# Patient Record
Sex: Male | Born: 1945 | Race: White | Hispanic: No | Marital: Married | Smoking: Current every day smoker
Health system: Southern US, Community
[De-identification: ages and names within clinical notes are randomized; demographics above are authoritative.]

## PROBLEM LIST (undated history)

## (undated) DIAGNOSIS — J439 Emphysema, unspecified: Secondary | ICD-10-CM

## (undated) DIAGNOSIS — I509 Heart failure, unspecified: Secondary | ICD-10-CM

## (undated) DIAGNOSIS — J449 Chronic obstructive pulmonary disease, unspecified: Secondary | ICD-10-CM

## (undated) DIAGNOSIS — I252 Old myocardial infarction: Secondary | ICD-10-CM

## (undated) DIAGNOSIS — F419 Anxiety disorder, unspecified: Secondary | ICD-10-CM

## (undated) DIAGNOSIS — M1990 Unspecified osteoarthritis, unspecified site: Secondary | ICD-10-CM

## (undated) DIAGNOSIS — K219 Gastro-esophageal reflux disease without esophagitis: Secondary | ICD-10-CM

## (undated) DIAGNOSIS — N390 Urinary tract infection, site not specified: Secondary | ICD-10-CM

## (undated) DIAGNOSIS — I10 Essential (primary) hypertension: Secondary | ICD-10-CM

## (undated) DIAGNOSIS — D649 Anemia, unspecified: Secondary | ICD-10-CM

## (undated) DIAGNOSIS — I2510 Atherosclerotic heart disease of native coronary artery without angina pectoris: Secondary | ICD-10-CM

## (undated) DIAGNOSIS — I639 Cerebral infarction, unspecified: Secondary | ICD-10-CM

## (undated) DIAGNOSIS — C801 Malignant (primary) neoplasm, unspecified: Secondary | ICD-10-CM

## (undated) DIAGNOSIS — I38 Endocarditis, valve unspecified: Secondary | ICD-10-CM

## (undated) DIAGNOSIS — Z9889 Other specified postprocedural states: Secondary | ICD-10-CM

## (undated) HISTORY — PX: LUNG LOBECTOMY: SHX167

## (undated) HISTORY — PX: ANGIOPLASTY: SHX39

## (undated) HISTORY — PX: TONSILLECTOMY: SUR1361

---

## 2014-12-29 ENCOUNTER — Encounter (HOSPITAL_COMMUNITY): Payer: Self-pay

## 2014-12-29 ENCOUNTER — Emergency Department (HOSPITAL_COMMUNITY): Payer: Medicare Other

## 2014-12-29 ENCOUNTER — Encounter (HOSPITAL_COMMUNITY)
Admission: EM | Disposition: A | Discharge status: Hospice | Payer: Self-pay | Source: Home / Self Care | Attending: Neurology

## 2014-12-29 ENCOUNTER — Emergency Department (HOSPITAL_COMMUNITY): Payer: Medicare Other | Admitting: Certified Registered"

## 2014-12-29 ENCOUNTER — Inpatient Hospital Stay (HOSPITAL_COMMUNITY)
Admission: EM | Admit: 2014-12-29 | Discharge: 2015-01-06 | DRG: 023 | Disposition: A | Discharge status: Hospice | Payer: Medicare Other | Attending: Neurology | Admitting: Neurology

## 2014-12-29 ENCOUNTER — Inpatient Hospital Stay (HOSPITAL_COMMUNITY): Payer: Medicare Other

## 2014-12-29 DIAGNOSIS — Z66 Do not resuscitate: Secondary | ICD-10-CM | POA: Diagnosis present

## 2014-12-29 DIAGNOSIS — Z515 Encounter for palliative care: Secondary | ICD-10-CM | POA: Diagnosis present

## 2014-12-29 DIAGNOSIS — Z9911 Dependence on respirator [ventilator] status: Secondary | ICD-10-CM

## 2014-12-29 DIAGNOSIS — R531 Weakness: Secondary | ICD-10-CM | POA: Diagnosis present

## 2014-12-29 DIAGNOSIS — R4701 Aphasia: Secondary | ICD-10-CM | POA: Diagnosis present

## 2014-12-29 DIAGNOSIS — I639 Cerebral infarction, unspecified: Secondary | ICD-10-CM

## 2014-12-29 DIAGNOSIS — E43 Unspecified severe protein-calorie malnutrition: Secondary | ICD-10-CM | POA: Diagnosis present

## 2014-12-29 DIAGNOSIS — K5793 Diverticulitis of intestine, part unspecified, without perforation or abscess with bleeding: Secondary | ICD-10-CM | POA: Diagnosis not present

## 2014-12-29 DIAGNOSIS — C3432 Malignant neoplasm of lower lobe, left bronchus or lung: Secondary | ICD-10-CM | POA: Diagnosis present

## 2014-12-29 DIAGNOSIS — G936 Cerebral edema: Secondary | ICD-10-CM | POA: Diagnosis present

## 2014-12-29 DIAGNOSIS — I672 Cerebral atherosclerosis: Secondary | ICD-10-CM | POA: Diagnosis present

## 2014-12-29 DIAGNOSIS — T17990A Other foreign object in respiratory tract, part unspecified in causing asphyxiation, initial encounter: Secondary | ICD-10-CM | POA: Diagnosis not present

## 2014-12-29 DIAGNOSIS — J9601 Acute respiratory failure with hypoxia: Secondary | ICD-10-CM | POA: Diagnosis present

## 2014-12-29 DIAGNOSIS — I6789 Other cerebrovascular disease: Secondary | ICD-10-CM | POA: Diagnosis not present

## 2014-12-29 DIAGNOSIS — F17200 Nicotine dependence, unspecified, uncomplicated: Secondary | ICD-10-CM | POA: Diagnosis present

## 2014-12-29 DIAGNOSIS — R739 Hyperglycemia, unspecified: Secondary | ICD-10-CM | POA: Diagnosis not present

## 2014-12-29 DIAGNOSIS — Z23 Encounter for immunization: Secondary | ICD-10-CM | POA: Diagnosis not present

## 2014-12-29 DIAGNOSIS — I63032 Cerebral infarction due to thrombosis of left carotid artery: Principal | ICD-10-CM

## 2014-12-29 DIAGNOSIS — J9811 Atelectasis: Secondary | ICD-10-CM | POA: Diagnosis present

## 2014-12-29 DIAGNOSIS — I110 Hypertensive heart disease with heart failure: Secondary | ICD-10-CM | POA: Diagnosis present

## 2014-12-29 DIAGNOSIS — I6602 Occlusion and stenosis of left middle cerebral artery: Secondary | ICD-10-CM | POA: Diagnosis present

## 2014-12-29 DIAGNOSIS — Z7901 Long term (current) use of anticoagulants: Secondary | ICD-10-CM

## 2014-12-29 DIAGNOSIS — I4891 Unspecified atrial fibrillation: Secondary | ICD-10-CM | POA: Diagnosis present

## 2014-12-29 DIAGNOSIS — R402432 Glasgow coma scale score 3-8, at arrival to emergency department: Secondary | ICD-10-CM | POA: Diagnosis present

## 2014-12-29 DIAGNOSIS — E876 Hypokalemia: Secondary | ICD-10-CM | POA: Diagnosis present

## 2014-12-29 DIAGNOSIS — J449 Chronic obstructive pulmonary disease, unspecified: Secondary | ICD-10-CM | POA: Diagnosis present

## 2014-12-29 DIAGNOSIS — Z6825 Body mass index (BMI) 25.0-25.9, adult: Secondary | ICD-10-CM

## 2014-12-29 DIAGNOSIS — K922 Gastrointestinal hemorrhage, unspecified: Secondary | ICD-10-CM | POA: Diagnosis present

## 2014-12-29 DIAGNOSIS — I2510 Atherosclerotic heart disease of native coronary artery without angina pectoris: Secondary | ICD-10-CM | POA: Diagnosis present

## 2014-12-29 DIAGNOSIS — K219 Gastro-esophageal reflux disease without esophagitis: Secondary | ICD-10-CM | POA: Diagnosis present

## 2014-12-29 DIAGNOSIS — J690 Pneumonitis due to inhalation of food and vomit: Secondary | ICD-10-CM | POA: Diagnosis not present

## 2014-12-29 DIAGNOSIS — R2990 Unspecified symptoms and signs involving the nervous system: Secondary | ICD-10-CM

## 2014-12-29 DIAGNOSIS — G9341 Metabolic encephalopathy: Secondary | ICD-10-CM | POA: Diagnosis present

## 2014-12-29 DIAGNOSIS — T17500A Unspecified foreign body in bronchus causing asphyxiation, initial encounter: Secondary | ICD-10-CM

## 2014-12-29 DIAGNOSIS — I6523 Occlusion and stenosis of bilateral carotid arteries: Secondary | ICD-10-CM | POA: Diagnosis not present

## 2014-12-29 DIAGNOSIS — I248 Other forms of acute ischemic heart disease: Secondary | ICD-10-CM | POA: Diagnosis present

## 2014-12-29 DIAGNOSIS — J9690 Respiratory failure, unspecified, unspecified whether with hypoxia or hypercapnia: Secondary | ICD-10-CM | POA: Insufficient documentation

## 2014-12-29 DIAGNOSIS — Z978 Presence of other specified devices: Secondary | ICD-10-CM

## 2014-12-29 DIAGNOSIS — E785 Hyperlipidemia, unspecified: Secondary | ICD-10-CM | POA: Diagnosis present

## 2014-12-29 DIAGNOSIS — Q251 Coarctation of aorta: Secondary | ICD-10-CM | POA: Diagnosis not present

## 2014-12-29 DIAGNOSIS — I63512 Cerebral infarction due to unspecified occlusion or stenosis of left middle cerebral artery: Secondary | ICD-10-CM

## 2014-12-29 DIAGNOSIS — C3490 Malignant neoplasm of unspecified part of unspecified bronchus or lung: Secondary | ICD-10-CM

## 2014-12-29 DIAGNOSIS — J9809 Other diseases of bronchus, not elsewhere classified: Secondary | ICD-10-CM | POA: Diagnosis not present

## 2014-12-29 DIAGNOSIS — G8194 Hemiplegia, unspecified affecting left nondominant side: Secondary | ICD-10-CM | POA: Diagnosis present

## 2014-12-29 DIAGNOSIS — I252 Old myocardial infarction: Secondary | ICD-10-CM

## 2014-12-29 DIAGNOSIS — G931 Anoxic brain damage, not elsewhere classified: Secondary | ICD-10-CM

## 2014-12-29 DIAGNOSIS — M6289 Other specified disorders of muscle: Secondary | ICD-10-CM | POA: Diagnosis not present

## 2014-12-29 DIAGNOSIS — J189 Pneumonia, unspecified organism: Secondary | ICD-10-CM | POA: Insufficient documentation

## 2014-12-29 DIAGNOSIS — I6529 Occlusion and stenosis of unspecified carotid artery: Secondary | ICD-10-CM | POA: Insufficient documentation

## 2014-12-29 DIAGNOSIS — I6340 Cerebral infarction due to embolism of unspecified cerebral artery: Secondary | ICD-10-CM | POA: Diagnosis not present

## 2014-12-29 DIAGNOSIS — I63031 Cerebral infarction due to thrombosis of right carotid artery: Secondary | ICD-10-CM | POA: Diagnosis not present

## 2014-12-29 HISTORY — DX: Malignant (primary) neoplasm, unspecified: C80.1

## 2014-12-29 HISTORY — DX: Anxiety disorder, unspecified: F41.9

## 2014-12-29 HISTORY — DX: Atherosclerotic heart disease of native coronary artery without angina pectoris: I25.10

## 2014-12-29 HISTORY — DX: Urinary tract infection, site not specified: N39.0

## 2014-12-29 HISTORY — DX: Emphysema, unspecified: J43.9

## 2014-12-29 HISTORY — DX: Unspecified osteoarthritis, unspecified site: M19.90

## 2014-12-29 HISTORY — DX: Chronic obstructive pulmonary disease, unspecified: J44.9

## 2014-12-29 HISTORY — DX: Old myocardial infarction: I25.2

## 2014-12-29 HISTORY — DX: Heart failure, unspecified: I50.9

## 2014-12-29 HISTORY — DX: Endocarditis, valve unspecified: I38

## 2014-12-29 HISTORY — DX: Cerebral infarction, unspecified: I63.9

## 2014-12-29 HISTORY — PX: RADIOLOGY WITH ANESTHESIA: SHX6223

## 2014-12-29 HISTORY — DX: Gastro-esophageal reflux disease without esophagitis: K21.9

## 2014-12-29 HISTORY — DX: Other specified postprocedural states: Z98.890

## 2014-12-29 HISTORY — DX: Anemia, unspecified: D64.9

## 2014-12-29 HISTORY — DX: Essential (primary) hypertension: I10

## 2014-12-29 LAB — BLOOD GAS, ARTERIAL
Acid-base deficit: 1.6 mmol/L (ref 0.0–2.0)
BICARBONATE: 22.1 meq/L (ref 20.0–24.0)
Drawn by: 270221
FIO2: 1
LHR: 14 {breaths}/min
MECHVT: 600 mL
O2 SAT: 100 %
PATIENT TEMPERATURE: 98.6
PCO2 ART: 34.2 mmHg — AB (ref 35.0–45.0)
PEEP/CPAP: 5 cmH2O
PO2 ART: 450 mmHg — AB (ref 80.0–100.0)
TCO2: 23.2 mmol/L (ref 0–100)
pH, Arterial: 7.426 (ref 7.350–7.450)

## 2014-12-29 LAB — I-STAT CHEM 8, ED
BUN: 19 mg/dL (ref 6–20)
CHLORIDE: 103 mmol/L (ref 101–111)
CREATININE: 1.2 mg/dL (ref 0.61–1.24)
Calcium, Ion: 1.08 mmol/L — ABNORMAL LOW (ref 1.13–1.30)
Glucose, Bld: 158 mg/dL — ABNORMAL HIGH (ref 65–99)
HEMATOCRIT: 32 % — AB (ref 39.0–52.0)
Hemoglobin: 10.9 g/dL — ABNORMAL LOW (ref 13.0–17.0)
Potassium: 3.9 mmol/L (ref 3.5–5.1)
SODIUM: 136 mmol/L (ref 135–145)
TCO2: 19 mmol/L (ref 0–100)

## 2014-12-29 LAB — MRSA PCR SCREENING: MRSA by PCR: NEGATIVE

## 2014-12-29 LAB — TRIGLYCERIDES: TRIGLYCERIDES: 118 mg/dL (ref <150)

## 2014-12-29 LAB — HEMOGLOBIN AND HEMATOCRIT, BLOOD
HCT: 29.1 % — ABNORMAL LOW (ref 39.0–52.0)
HCT: 30.7 % — ABNORMAL LOW (ref 39.0–52.0)
HEMOGLOBIN: 9.3 g/dL — AB (ref 13.0–17.0)
Hemoglobin: 9.7 g/dL — ABNORMAL LOW (ref 13.0–17.0)

## 2014-12-29 LAB — TROPONIN I
TROPONIN I: 0.29 ng/mL — AB (ref <0.031)
Troponin I: 0.32 ng/mL — ABNORMAL HIGH (ref <0.031)

## 2014-12-29 SURGERY — RADIOLOGY WITH ANESTHESIA
Anesthesia: General

## 2014-12-29 MED ORDER — ROCURONIUM BROMIDE 100 MG/10ML IV SOLN
INTRAVENOUS | Status: DC | PRN
  Administered 2014-12-29: 15 mg via INTRAVENOUS
  Administered 2014-12-29: 30 mg via INTRAVENOUS
  Administered 2014-12-29: 25 mg via INTRAVENOUS

## 2014-12-29 MED ORDER — IOHEXOL 300 MG/ML  SOLN
150.0000 mL | Freq: Once | INTRAMUSCULAR | Status: DC | PRN
  Administered 2014-12-29: 75 mL via INTRAVENOUS
  Filled 2014-12-29: qty 150

## 2014-12-29 MED ORDER — SUCCINYLCHOLINE CHLORIDE 20 MG/ML IJ SOLN
INTRAMUSCULAR | Status: DC | PRN
  Administered 2014-12-29: 120 mg via INTRAVENOUS

## 2014-12-29 MED ORDER — LACTATED RINGERS IV SOLN
INTRAVENOUS | Status: DC | PRN
  Administered 2014-12-29: 14:00:00 via INTRAVENOUS

## 2014-12-29 MED ORDER — PANTOPRAZOLE SODIUM 40 MG IV SOLR
40.0000 mg | INTRAVENOUS | Status: DC
  Administered 2014-12-29: 40 mg via INTRAVENOUS
  Filled 2014-12-29: qty 40

## 2014-12-29 MED ORDER — IOHEXOL 350 MG/ML SOLN
50.0000 mL | Freq: Once | INTRAVENOUS | Status: AC | PRN
  Administered 2014-12-29: 50 mL via INTRAVENOUS

## 2014-12-29 MED ORDER — FENTANYL CITRATE (PF) 100 MCG/2ML IJ SOLN
INTRAMUSCULAR | Status: DC | PRN
  Administered 2014-12-29 (×2): 50 ug via INTRAVENOUS

## 2014-12-29 MED ORDER — NICARDIPINE HCL IN NACL 20-0.86 MG/200ML-% IV SOLN: 5.0000 mg/h | INTRAVENOUS | Status: DC

## 2014-12-29 MED ORDER — SENNOSIDES-DOCUSATE SODIUM 8.6-50 MG PO TABS
1.0000 | ORAL_TABLET | Freq: Every evening | ORAL | Status: DC | PRN
  Filled 2014-12-29: qty 1

## 2014-12-29 MED ORDER — LABETALOL HCL 5 MG/ML IV SOLN
20.0000 mg | INTRAVENOUS | Status: AC | PRN
  Administered 2014-12-29 – 2015-01-02 (×10): 20 mg via INTRAVENOUS
  Filled 2014-12-29 (×11): qty 4

## 2014-12-29 MED ORDER — NITROGLYCERIN 1 MG/10 ML FOR IR/CATH LAB
INTRA_ARTERIAL | Status: AC
  Filled 2014-12-29: qty 10

## 2014-12-29 MED ORDER — EPTIFIBATIDE 20 MG/10ML IV SOLN
20.0000 mg | Freq: Once | INTRAVENOUS | Status: AC
  Administered 2014-12-29 (×2): 2 mg via INTRAVENOUS
  Filled 2014-12-29: qty 10

## 2014-12-29 MED ORDER — ACETYLCYSTEINE 20 % IN SOLN
4.0000 mL | Freq: Four times a day (QID) | RESPIRATORY_TRACT | Status: DC
  Administered 2014-12-29: 4 mL via RESPIRATORY_TRACT
  Filled 2014-12-29 (×4): qty 4

## 2014-12-29 MED ORDER — LIDOCAINE HCL (CARDIAC) 20 MG/ML IV SOLN
INTRAVENOUS | Status: DC | PRN
  Administered 2014-12-29: 60 mg via INTRAVENOUS

## 2014-12-29 MED ORDER — FENTANYL CITRATE (PF) 100 MCG/2ML IJ SOLN
50.0000 ug | INTRAMUSCULAR | Status: DC | PRN
  Administered 2015-01-01 (×2): 50 ug via INTRAVENOUS
  Filled 2014-12-29 (×2): qty 2

## 2014-12-29 MED ORDER — PROPOFOL 500 MG/50ML IV EMUL
INTRAVENOUS | Status: DC | PRN
  Administered 2014-12-29: 100 ug/kg/min via INTRAVENOUS

## 2014-12-29 MED ORDER — PROPOFOL 1000 MG/100ML IV EMUL
0.0000 ug/kg/min | INTRAVENOUS | Status: DC
  Administered 2014-12-29 – 2014-12-30 (×4): 40 ug/kg/min via INTRAVENOUS
  Administered 2014-12-30: 20 ug/kg/min via INTRAVENOUS
  Administered 2014-12-30 – 2015-01-01 (×5): 30 ug/kg/min via INTRAVENOUS
  Filled 2014-12-29 (×10): qty 100

## 2014-12-29 MED ORDER — LABETALOL HCL 5 MG/ML IV SOLN
INTRAVENOUS | Status: DC | PRN
  Administered 2014-12-29: 5 mg via INTRAVENOUS

## 2014-12-29 MED ORDER — ALBUTEROL SULFATE (2.5 MG/3ML) 0.083% IN NEBU
2.5000 mg | INHALATION_SOLUTION | Freq: Four times a day (QID) | RESPIRATORY_TRACT | Status: AC
  Administered 2014-12-29 – 2014-12-30 (×4): 2.5 mg via RESPIRATORY_TRACT
  Filled 2014-12-29 (×3): qty 3

## 2014-12-29 MED ORDER — ONDANSETRON HCL 4 MG/2ML IJ SOLN: 4.0000 mg | Freq: Four times a day (QID) | INTRAMUSCULAR | Status: DC | PRN

## 2014-12-29 MED ORDER — PROPOFOL 10 MG/ML IV BOLUS
INTRAVENOUS | Status: DC | PRN
  Administered 2014-12-29: 150 mg via INTRAVENOUS

## 2014-12-29 MED ORDER — SODIUM CHLORIDE 0.9 % IV SOLN
INTRAVENOUS | Status: DC
  Administered 2014-12-29 – 2015-01-03 (×8): via INTRAVENOUS

## 2014-12-29 MED ORDER — ACETAMINOPHEN 650 MG RE SUPP
650.0000 mg | Freq: Four times a day (QID) | RECTAL | Status: DC | PRN
  Administered 2014-12-29 – 2014-12-30 (×2): 650 mg via RECTAL
  Filled 2014-12-29 (×2): qty 1

## 2014-12-29 MED ORDER — PHENYLEPHRINE HCL 10 MG/ML IJ SOLN
10.0000 mg | INTRAVENOUS | Status: DC | PRN
  Administered 2014-12-29: 20 ug/min via INTRAVENOUS

## 2014-12-29 MED ORDER — ACETYLCYSTEINE 20 % IN SOLN
4.0000 mL | Freq: Four times a day (QID) | RESPIRATORY_TRACT | Status: DC
  Administered 2014-12-30 – 2015-01-02 (×14): 4 mL via RESPIRATORY_TRACT
  Filled 2014-12-29 (×23): qty 4

## 2014-12-29 MED ORDER — STROKE: EARLY STAGES OF RECOVERY BOOK
Freq: Once | Status: AC
  Administered 2014-12-29: 17:00:00
  Filled 2014-12-29: qty 1

## 2014-12-29 MED ORDER — PROPOFOL 1000 MG/100ML IV EMUL
INTRAVENOUS | Status: AC
  Filled 2014-12-29: qty 100

## 2014-12-29 MED ORDER — EPTIFIBATIDE 20 MG/10ML IV SOLN
INTRAVENOUS | Status: AC
  Administered 2014-12-29: 2 mg via INTRAVENOUS
  Filled 2014-12-29: qty 10

## 2014-12-29 MED ORDER — EPTIFIBATIDE 20 MG/10ML IV SOLN
INTRAVENOUS | Status: AC
  Filled 2014-12-29: qty 10

## 2014-12-29 MED ORDER — ACETAMINOPHEN 500 MG PO TABS
1000.0000 mg | ORAL_TABLET | Freq: Four times a day (QID) | ORAL | Status: DC | PRN
  Administered 2014-12-30 – 2015-01-01 (×4): 1000 mg via ORAL
  Filled 2014-12-29 (×4): qty 2

## 2014-12-29 MED ORDER — SODIUM CHLORIDE 0.9 % IV SOLN
1.0000 g | Freq: Once | INTRAVENOUS | Status: AC
  Administered 2014-12-29: 1 g via INTRAVENOUS
  Filled 2014-12-29: qty 10

## 2014-12-29 MED ORDER — FENTANYL CITRATE (PF) 100 MCG/2ML IJ SOLN
50.0000 ug | INTRAMUSCULAR | Status: DC | PRN
  Administered 2014-12-30 – 2015-01-02 (×8): 50 ug via INTRAVENOUS
  Filled 2014-12-29 (×8): qty 2

## 2014-12-29 NOTE — Transfer of Care (Signed)
Immediate Anesthesia Transfer of Care Note  Patient: Joseph Reed  Procedure(s) Performed: Procedure(s): RADIOLOGY WITH ANESTHESIA (N/A)  Patient Location: ICU  Anesthesia Type:General  Level of Consciousness: unresponsive and Patient remains intubated per anesthesia plan  Airway & Oxygen Therapy: Patient remains intubated per anesthesia plan and Patient placed on Ventilator (see vital sign flow sheet for setting)  Post-op Assessment: Report given to RN and Post -op Vital signs reviewed and stable  Post vital signs: Reviewed and stable  Last Vitals:  Filed Vitals:   12/29/14 1300  BP: 156/67  Pulse: 111  Temp:   Resp: 17    Complications: No apparent anesthesia complications

## 2014-12-29 NOTE — Progress Notes (Signed)
Report given to [facility], pt to be transferred to 3M11 with anesthesia, Cruzita Lederer, and IR tech

## 2014-12-29 NOTE — ED Provider Notes (Signed)
CSN: [id number]     Arrival date & time 12/29/14  1123 History   First MD Initiated Contact with Patient 12/29/14 1133     Chief Complaint  Patient presents with  . Stroke Symptoms     The history is provided by the EMS personnel. No language interpreter was used.   Mr. Strahan presents for evaluation of stroke sxs as a transfer from [facility].  He has a hx/o CAD, CVA, lung cancer, COPD.  He was admitted for anemia with upper GI bleed two days ago.  Last night around 9 or 10 pm he was noted to have a change in his mental status and was noted to have left sided hemiplegia this morning.  He has a hx/o prior stroke affecting his right side.  He is transferred for further evaluation of his new stroke sxs.    Past Medical History  Diagnosis Date  . Coronary artery disease   . Hypertension   . CHF (congestive heart failure) ([place])   . 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 Hx of cardiac cath   . Valvular heart disease   . COPD (chronic obstructive pulmonary disease) ([place])   . Emphysema/COPD ([place])   . UTI (lower urinary tract infection)   . GERD (gastroesophageal reflux disease)   . Arthritis   . Cancer ([place])     lung- squamous cell carcinoma- IIIA  . Anemia   . Stroke ([place])     L sided weakness  . Anxiety    Past Surgical History  Procedure Laterality Date  . Tonsillectomy    . Angioplasty      stent april 2016  . Lung lobectomy     No family history on file. Social History  Substance Use Topics  . Smoking status: Current Every Day Smoker -- 0.50 packs/day  . Smokeless tobacco: None  . Alcohol Use: No    Review of Systems  Unable to perform ROS: Mental status change      Allergies  Tetracyclines & related  Home Medications   Prior to Admission medications   Not on File   BP 152/76 mmHg  Pulse 96  Temp(Src) 98.1 F (36.7 C) (Oral)  Resp 16  Ht '6\' 1"'$  (1.854 m)  Wt 191 lb 5.8 oz (86.8 kg)  BMI 25.25 kg/m2  SpO2 89% Physical Exam  Constitutional: He  appears well-developed.  Chronically ill appearing  HENT:  Head: Normocephalic and atraumatic.  Cardiovascular:  No murmur heard. Tachycardic, irregular  Pulmonary/Chest: Effort normal. No respiratory distress.  Abdominal: Soft. There is no tenderness. There is no rebound and no guarding.  Musculoskeletal: He exhibits no edema or tenderness.  Neurological:  Lethargic, moans to painful stimuli.  Weakly withdraws to painful stimuli in BUE.    Skin: Skin is warm and dry.  Psychiatric:  Unable to assess  Nursing note and vitals reviewed.   ED Course  Procedures (including critical care time) Labs Review Labs Reviewed  BLOOD GAS, ARTERIAL - Abnormal; Notable for the following:    pCO2 arterial 34.2 (*)    pO2, Arterial 450 (*)    All other components within normal limits  I-STAT CHEM 8, ED - Abnormal; Notable for the following:    Glucose, Bld 158 (*)    Calcium, Ion 1.08 (*)    Hemoglobin 10.9 (*)    HCT 32.0 (*)    All other components within normal limits  MRSA PCR SCREENING  TRIGLYCERIDES  HEMOGLOBIN AND HEMATOCRIT,  BLOOD  HEMOGLOBIN AND HEMATOCRIT, BLOOD  TROPONIN I  TROPONIN I  TROPONIN I  HEMOGLOBIN A1C  LIPID PANEL  CBC  BASIC METABOLIC PANEL    Imaging Review Ct Angio Head W/cm &/or Wo Cm  12/29/2014  CLINICAL DATA:  New onset right-sided weakness and aphasia EXAM: CT ANGIOGRAPHY HEAD AND NECK TECHNIQUE: Multidetector CT imaging of the head and neck was performed using the standard protocol during bolus administration of intravenous contrast. Multiplanar CT image reconstructions and MIPs were obtained to evaluate the vascular anatomy. Carotid stenosis measurements (when applicable) are obtained utilizing NASCET criteria, using the distal internal carotid diameter as the denominator. CONTRAST:  55m OMNIPAQUE IOHEXOL 350 MG/ML SOLN COMPAR[facility]  [facility] FINDINGS: CTA NECK Aortic arch: Extensive atheromatous changes to the aortic arch.  No dissection or visualized aneurysm. 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 Right carotid system: Diffuse atheromatous changes. The ICA has an irregular and flattened appearance with peripheral low-density appearance, suggesting dissection, likely chronic. Minimal lumen at the skullbase is 2 to 3 mm. Left carotid system: Diffuse atheromatous changes, predominately noncalcified with extensive plaque irregularity along the distal common carotid artery. Just beyond the bifurcation, there is graded appearance of the contrast with complete occlusion by the mid cervical ICA level. Ophthalmic segment reconstitution described below Vertebral arteries:Dominant left vertebral artery has tandem stenoses of the V1 segment, high-grade at the distal V1 segment. Diffuse atheromatous narrowing of the left vertebral artery, moderate at the level of C3. Non dominant right vertebral artery origin high-grade stenosis. Other neck: Diffuse spondylosis and facet arthropathy. No acute/ contributory findings. 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 CTA HEAD Anterior circulation: Left ICA occlusion with ophthalmic segment reconstitution followed by a moderate supraclinoid ICA stenosis. There is multi focal left MCA stenoses, occluding high-grade anterior most left M2 branch. Diffuse irregularity of the right carotid siphon with multi focal moderate stenosis, greatest at the proximal petrous segment, ophthalmic segment, and supraclinoid segments. There is a fetal type right PCA. Inferior division right M1 -M2 vessel is abruptly truncated, correlating with a dense infarct. Posterior circulation: Non dominant right vertebral artery ends in the patent right PICA. The left vertebral artery serves the upper cerebellar arteries and left PCA given fetal type right PCA. At the left P2-P3 junction there is diffuse high-grade narrowing correlating with occipital infarct. Venous sinuses: Patent as permitted by  contrast timing Anatomic variants: Fetal type right PCA Delayed phase: No parenchymal enhancement These results were called by telephone at the time of interpretation on 12/29/2014 at 12:45 pm to Dr. SLuanne Bras, who verbally acknowledged these results. Findings also discussed with Dr. RDoy Minceat 12:50 p.m. IMPRESSION: 1. Acute appearing occlusion of the left ICA from the cervical portion to the ophthalmic segment. 2. Right M1 -M2 branch occlusion of the inferior division with completed infarct. 3. High-grade stenosis of the P2 and P3 segments with completed medial occipital lobe infarct. 4. Diffuse moderate right ICA stenosis with appearance of dissection, favored chronic given low-density mural appearance. 5. High-grade stenosis of the bilateral V1 segments. 6. Extensive intracranial atherosclerosis with multi focal moderate stenosis. Electronically Signed   By: JMonte FantasiaM.D.   On: 12/29/2014 12:58   Ct Head Wo Contrast  12/29/2014  CLINICAL DATA:  Acute onset of RIGHT-sided weakness and aphasia. History of atrial fibrillation. 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 EXAM: CT HEAD WITHOUT CONTRAST TECHNIQUE: Contiguous axial images were obtained from the base of  the skull through the vertex without intravenous contrast. COMPARISON:  Multiple prior exams earlier today including CTA head neck and CT perfusion. CT head 12/28/2014. MR head 12/07/2014. FINDINGS: Intravascular blood pool related to recent angiography. Acute RIGHT MCA lateral temporal lobe and parietal lobe infarcts are stable as demonstrated on earlier perfusion scan. Acute LEFT MCA medial occipital lobe infarct also stable, as demonstrated on earlier perfusion scan. No definite acute RIGHT MCA or ACA cortical infarction, although difficult to exclude subcortical or periventricular white matter ischemia given the background of pre-existing chronic microvascular ischemic change. No evidence for complication related to  endovascular treatment, such as subarachnoid or parenchymal hemorrhage. Scattered areas of chronic lacunar infarction affecting the deep nuclei, cerebellum, and periventricular white matter are redemonstrated and stable. Luxury perfusion involving the cingulate gyrus on the RIGHT medial posterior frontal lobe reflecting subacute RIGHT ACA infarct which occurred earlier in October. IMPRESSION: No evidence for complication related endovascular treatment, such as subarachnoid or parenchymal hemorrhage. No evidence for cortical ischemia within the LEFT MCA or LEFT ACA territory. Acute RIGHT MCA and LEFT PCA infarcts appear unchanged as previously outlined on earlier perfusion scan. These have developed since previous MR 12/07/2014 and from previous CT of 12/28/2014. Electronically Signed   By: Staci Righter M.D.   On: 12/29/2014 16:13   Ct Angio Neck W/cm &/or Wo/cm  12/29/2014  CLINICAL DATA:  New onset right-sided weakness and aphasia EXAM: CT ANGIOGRAPHY HEAD AND NECK TECHNIQUE: Multidetector CT imaging of the head and neck was performed using the standard protocol during bolus administration of intravenous contrast. Multiplanar CT image reconstructions and MIPs were obtained to evaluate the vascular anatomy. Carotid stenosis measurements (when applicable) are obtained utilizing NASCET criteria, using the distal internal carotid diameter as the denominator. CONTRAST:  5m OMNIPAQUE IOHEXOL 350 MG/ML SOLN COMPARISON[facility]  [facility] FINDINGS: CTA NECK Aortic arch: Extensive atheromatous changes to the aortic arch. No dissection or visualized aneurysm. 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 Right carotid system: Diffuse atheromatous changes. The ICA has an irregular and flattened appearance with peripheral low-density appearance, suggesting dissection, likely chronic. Minimal lumen at the skullbase is 2 to 3 mm. Left carotid system: Diffuse atheromatous changes,  predominately noncalcified with extensive plaque irregularity along the distal common carotid artery. Just beyond the bifurcation, there is graded appearance of the contrast with complete occlusion by the mid cervical ICA level. Ophthalmic segment reconstitution described below Vertebral arteries:Dominant left vertebral artery has tandem stenoses of the V1 segment, high-grade at the distal V1 segment. Diffuse atheromatous narrowing of the left vertebral artery, moderate at the level of C3. Non dominant right vertebral artery origin high-grade stenosis. Other neck: Diffuse spondylosis and facet arthropathy. No acute/ contributory findings. 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 CTA HEAD Anterior circulation: Left ICA occlusion with ophthalmic segment reconstitution followed by a moderate supraclinoid ICA stenosis. There is multi focal left MCA stenoses, occluding high-grade anterior most left M2 branch. Diffuse irregularity of the right carotid siphon with multi focal moderate stenosis, greatest at the proximal petrous segment, ophthalmic segment, and supraclinoid segments. There is a fetal type right PCA. Inferior division right M1 -M2 vessel is abruptly truncated, correlating with a dense infarct. Posterior circulation: Non dominant right vertebral artery ends in the patent right PICA. The left vertebral artery serves the upper cerebellar arteries and left PCA given fetal type right PCA. At the left P2-P3 junction there is diffuse high-grade narrowing correlating with occipital infarct.  Venous sinuses: Patent as permitted by contrast timing Anatomic variants: Fetal type right PCA Delayed phase: No parenchymal enhancement These results were called by telephone at the time of interpretation on 12/29/2014 at 12:45 pm to Dr. Luanne Bras , who verbally acknowledged these results. Findings also discussed with Dr. Doy Mince at 12:50 p.m. IMPRESSION: 1. Acute appearing occlusion of the left ICA from  the cervical portion to the ophthalmic segment. 2. Right M1 -M2 branch occlusion of the inferior division with completed infarct. 3. High-grade stenosis of the P2 and P3 segments with completed medial occipital lobe infarct. 4. Diffuse moderate right ICA stenosis with appearance of dissection, favored chronic given low-density mural appearance. 5. High-grade stenosis of the bilateral V1 segments. 6. Extensive intracranial atherosclerosis with multi focal moderate stenosis. Electronically Signed   By: Monte Fantasia M.D.   On: 12/29/2014 12:58   Ct Cerebral Perfusion W/cm  12/29/2014  CLINICAL DATA:  Stroke-like symptoms. EXAM: CT CEREBRAL PERFUSION WITH CONTRAST TECHNIQUE: After bolus administration of intravenous contrast repeated imaging through the cerebral hemispheres was performed from the level of the sella nearly to the vertex. CONTRAST:  19m OMNIPAQUE IOHEXOL 350 MG/ML SOLN COMPARISON:  [facility] CT there is gray-white loss differentiation in the mesial left occipital lobe and in the posterior right temporal and right parietal lobes consistent with early infarct. No acute intracranial hemorrhage. No hydrocephalus or shift. There is prolonged transit time and decreased cerebral blood volume throughout the lateral right temporal lobe and right parietal lobe consistent with completed infarct. Matched decreased blood volume and prolonged transit time in the left occipital lobe consistent with acute PCA territory infarct. There is also asymmetrically prolonged transit time throughout the remainder of the left cerebral hemisphere, greatest along the watershed in this patient with left ICA occlusion. There is relatively preserved blood volume in this distribution compatible with tissue at risk. These results were called by telephone at the time of interpretation on 12/29/2014 at 12:41 pm to Dr. LAlexis Goodell, who verbally acknowledged these  results. IMPRESSION: 1. Completed infarcts involving the inferior division right MCA territory and in the left PCA territory at the occipital lobe. Associated cytotoxic edema is newly seen since yesterday's CT. 2. Extensive left cerebral ischemia/penumbra in the setting of left ICA occlusion. Electronically Signed   By: JMonte FantasiaM.D.   On: 12/29/2014 12:59   Dg Chest Portable 1 View  12/29/2014  CLINICAL DATA:  Endotracheal tube placement. EXAM: PORTABLE CHEST 1 VIEW COMPARISON:  12/26/2014 FINDINGS: Endotracheal tube is in place, tip 5.8 cm above carina. Nasogastric tube has been placed, tip beyond the gastroesophageal junction and off the image. Side port is at the level of the gastroesophageal junction. There is new near complete opacity of the left hemithorax. There is increased mediastinal shift towards the left. Surgical clips in the left hilar region consistent with previous partial lobectomy. Suspect left pleural effusion in addition to atelectasis or infiltrate. The right lung is hyperexpanded and clear. IMPRESSION: 1. New significant opacity in the left lung consisting of left pleural effusion and atelectasis or infiltrate. 2. Postoperative changes in the left lung. 3. Placement of endotracheal tube and nasogastric tube. Electronically Signed   By: ENolon NationsM.D.   On: 12/29/2014 16:57   I have personally reviewed and evaluated these images and lab results as part of my medical decision-making.   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  Seen by Dr. Doy Mince with Neurology and myself on ED arrival.  Pt went urgently to scanner for perfusion study, followed  by IR for intervention.    Quintella Reichert, MD 12/29/14 2003

## 2014-12-29 NOTE — Progress Notes (Signed)
eLink Physician-Brief Progress Note Patient Name: Yavuz Kirby DOB: [birth year] MRN: [id number]   Date of Service  12/29/2014  HPI/Events of Note  Mucus plug on Lt on CXR.    eICU Interventions  Will order albuterol, mucomyst, chest PT.  Will have bedside team suction pt.  If no improvement, then might need bronchoscopy.       Intervention Category Major Interventions: Other:  Zhoey Blackstock 12/29/2014, 5:41 PM

## 2014-12-29 NOTE — Anesthesia Postprocedure Evaluation (Signed)
  Anesthesia Post-op Note  Patient: Joseph Reed  Procedure(s) Performed: Procedure(s): RADIOLOGY WITH ANESTHESIA (N/A)  Patient Location: PACU  Anesthesia Type:General  Level of Consciousness: awake, alert  and oriented  Airway and Oxygen Therapy: Patient Spontanous Breathing  Post-op Pain: mild  Post-op Assessment: Post-op Vital signs reviewed and Patient's Cardiovascular Status Stable LLE Motor Response: Purposeful movement (w/d to pain)   RLE Motor Response: Purposeful movement (w/d to pain)        Post-op Vital Signs: Reviewed and stable  Last Vitals:  Filed Vitals:   12/29/14 1930  BP: 134/74  Pulse: 85  Temp: 37.2 C  Resp: 15    Complications: No apparent anesthesia complications

## 2014-12-29 NOTE — ED Notes (Signed)
Pt. Presents as tx from [place]. Pt. Was admitted 10/23 for generalized weakness. Pt. With hx of L sided weakness from previous CVA. Pt. Was found to be less responsive this AM, unable to follow commands, with little to no R sided movement. Pt. LKW 2100 yesterday. Pt. Responsive only to painful stimuli.

## 2014-12-29 NOTE — Progress Notes (Signed)
Right groin pressure dressing intact, verified with Heather RN on 55M, pulses palpated +2, pt transported to 55M by CRNA, Lexine Baton RT, and [employer].

## 2014-12-29 NOTE — Consult Note (Signed)
PULMONARY / CRITICAL CARE MEDICINE   Name: Joseph Reed MRN: [id number] DOB: May 15, 1945    ADMISSION DATE:  12/29/2014 CONSULTATION DATE:  12/29/14  REFERRING MD :  Estanislado Pandy  CHIEF COMPLAINT:  CVA  INITIAL PRESENTATION:  69 y.o. M admitted to [facility] 10/26 with generalized weakness.  10/26, developed right sided weakness and inability to follow commands.  Found to have acute CVA, transferred to [facility], intubated and taken for IR revascularization.   STUDIES:  CT cerebral perfusion 10/26 >>> completed infarcts involving the inferior division right MCA and left PCA territory at the occipital lobe.  Associated cytotoxic edema noted.  Extensive left cerebral ischemia in the setting of left ICA occlusion. CTA head / neck 10/26 >>> acute appearing occlusion of left ICA.  Right M1 - M2 branch occlusion with complete infarct.  High grade stenosis of P2 and P3 segments.  Diffuse moderate right ICA stenosis.  SIGNIFICANT EVENTS: 10/23 - admitted to [facility] with generalized weakness. 10/26 - transferred to [facility] for acute CVA.  Taken to IR for revascularization.   HISTORY OF PRESENT ILLNESS:  Pt is encephalopathic; therefore, this HPI is obtained from chart review. Joseph Reed is a 69 y.o. M with PMH as outlined below including remote CVA with residual L sided weakness.  He was admitted to [facility] 10/23 for generalized weakness.  He apparently had been having dark stools over a few weeks.  He had been taking Motrin at least twice per day for arthritic pains.  He also is on plavix and xarelto for Afib and his hx of prior CVA.  In ED at [facility], his Hgb was 5.8 and his FOBT was positive. During his stay at [facility], he had 4 PRBC transfusions.  He apparently had an MI in April 2015 treated with stent.  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  He underwent LLL lobectomy in July 2015 and was  supposed to have post op chemo and radiation but pt apparently declined.  On morning of 10/26, he was found to be less responsive with inability to follow commands and little to no right sided movement.  CT revealed acute infarcts as above.  He was transferred to [facility] where he was intubated and underwent IR revascularization.  PAST MEDICAL HISTORY :   has a past medical history of Coronary artery disease; Hypertension; CHF (congestive heart failure) ([place]); MI, old; cardiac cath; Valvular heart disease; COPD (chronic obstructive pulmonary disease) ([place]); Emphysema/COPD [facility]); UTI (lower urinary tract infection); GERD (gastroesophageal reflux disease); Arthritis; Cancer ([place]); Anemia; Stroke ([place]); and Anxiety.  has past surgical history that includes Tonsillectomy; Angioplasty; and Lung lobectomy.   MEDICATIONS: Plavix '75mg'$  daily Ranexa '500mg'$  BID Coreg 3.'125mg'$  BID Nitro 0.'4mg'$  Xarelto '20mg'$  daily   Prior to Admission medications   Not on File   Allergies  Allergen Reactions  . Tetracyclines & Related     FAMILY HISTORY:  No family history on file.  SOCIAL HISTORY:  reports that he has been smoking.  He does not have any smokeless tobacco history on file. He reports that he does not drink alcohol or use illicit drugs.  REVIEW OF SYSTEMS:  Unable to obtain as pt is encephalopathic.  SUBJECTIVE:   VITAL SIGNS: Temp:  [98.1 F (36.7 C)] 98.1 F (36.7 C) (10/26 1210) Pulse Rate:  [103-116] 111 (10/26 1300) Resp:  [15-24] 17 (10/26 1300) BP: (143-163)/(67-82) 156/67 mmHg (10/26  1300) SpO2:  [98 %-100 %] 100 % (10/26 1300) HEMODYNAMICS:   VENTILATOR SETTINGS:   INTAKE / OUTPUT: Intake/Output    None     PHYSICAL EXAMINATION: General: Chronically ill appearing male, resting in bed, in NAD. Neuro: Sedated. Does not follow commands. HEENT: [place]/AT. PERRL, sclerae anicteric.  Poor dentition. Cardiovascular: IRIR, no M/R/G.  Lungs: Respirations even and unlabored.  CTA  bilaterally, No W/R/R.  Abdomen: BS x 4, soft, NT/ND.  Musculoskeletal: No gross deformities, no edema.  Skin: Intact, warm, no rashes.    LABS:  CBC  Recent Labs Lab 12/29/14 1245  HGB 10.9*  HCT 32.0*   Coag's No results for input(s): APTT, INR in the last 168 hours. BMET  Recent Labs Lab 12/29/14 1245  NA 136  K 3.9  CL 103  BUN 19  CREATININE 1.20  GLUCOSE 158*   Electrolytes No results for input(s): CALCIUM, MG, PHOS in the last 168 hours. Sepsis Markers No results for input(s): LATICACIDVEN, PROCALCITON, O2SATVEN in the last 168 hours. ABG No results for input(s): PHART, PCO2ART, PO2ART in the last 168 hours. Liver Enzymes No results for input(s): AST, ALT, ALKPHOS, BILITOT, ALBUMIN in the last 168 hours. Cardiac Enzymes No results for input(s): TROPONINI, PROBNP in the last 168 hours. Glucose No results for input(s): GLUCAP in the last 168 hours.  Imaging Ct Angio Head W/cm &/or Wo Cm  12/29/2014  CLINICAL DATA:  New onset right-sided weakness and aphasia EXAM: CT ANGIOGRAPHY HEAD AND NECK TECHNIQUE: Multidetector CT imaging of the head and neck was performed using the standard protocol during bolus administration of intravenous contrast. Multiplanar CT image reconstructions and MIPs were obtained to evaluate the vascular anatomy. Carotid stenosis measurements (when applicable) are obtained utilizing NASCET criteria, using the distal internal carotid diameter as the denominator. CONTRAST:  46m OMNIPAQUE IOHEXOL 350 MG/ML SOLN [facility] FINDINGS: CTA NECK Aortic arch: Extensive atheromatous changes to the aortic arch. No dissection or visualized aneurysm. 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 Right carotid system: Diffuse atheromatous changes. The ICA has an irregular and flattened appearance with peripheral low-density appearance, suggesting dissection, likely chronic. Minimal lumen at the skullbase  is 2 to 3 mm. Left carotid system: Diffuse atheromatous changes, predominately noncalcified with extensive plaque irregularity along the distal common carotid artery. Just beyond the bifurcation, there is graded appearance of the contrast with complete occlusion by the mid cervical ICA level. Ophthalmic segment reconstitution described below Vertebral arteries:Dominant left vertebral artery has tandem stenoses of the V1 segment, high-grade at the distal V1 segment. Diffuse atheromatous narrowing of the left vertebral artery, moderate at the level of C3. Non dominant right vertebral artery origin high-grade stenosis. Other neck: Diffuse spondylosis and facet arthropathy. No acute/ contributory findings. 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 CTA HEAD Anterior circulation: Left ICA occlusion with ophthalmic segment reconstitution followed by a moderate supraclinoid ICA stenosis. There is multi focal left MCA stenoses, occluding high-grade anterior most left M2 branch. Diffuse irregularity of the right carotid siphon with multi focal moderate stenosis, greatest at the proximal petrous segment, ophthalmic segment, and supraclinoid segments. There is a fetal type right PCA. Inferior division right M1 -M2 vessel is abruptly truncated, correlating with a dense infarct. Posterior circulation: Non dominant right vertebral artery ends in the patent right PICA. The left vertebral artery serves the upper cerebellar arteries and left PCA given fetal type right PCA. At the left P2-P3 junction there is  diffuse high-grade narrowing correlating with occipital infarct. Venous sinuses: Patent as permitted by contrast timing Anatomic variants: Fetal type right PCA Delayed phase: No parenchymal enhancement These results were called by telephone at the time of interpretation on 12/29/2014 at 12:45 pm to Dr. Luanne Bras , who verbally acknowledged these results. Findings also discussed with Dr. Doy Mince at 12:50  p.m. IMPRESSION: 1. Acute appearing occlusion of the left ICA from the cervical portion to the ophthalmic segment. 2. Right M1 -M2 branch occlusion of the inferior division with completed infarct. 3. High-grade stenosis of the P2 and P3 segments with completed medial occipital lobe infarct. 4. Diffuse moderate right ICA stenosis with appearance of dissection, favored chronic given low-density mural appearance. 5. High-grade stenosis of the bilateral V1 segments. 6. Extensive intracranial atherosclerosis with multi focal moderate stenosis. Electronically Signed   By: Monte Fantasia M.D.   On: 12/29/2014 12:58   Ct Angio Neck W/cm &/or Wo/cm  12/29/2014  CLINICAL DATA:  New onset right-sided weakness and aphasia EXAM: CT ANGIOGRAPHY HEAD AND NECK TECHNIQUE: Multidetector CT imaging of the head and neck was performed using the standard protocol during bolus administration of intravenous contrast. Multiplanar CT image reconstructions and MIPs were obtained to evaluate the vascular anatomy. Carotid stenosis measurements (when applicable) are obtained utilizing NASCET criteria, using the distal internal carotid diameter as the denominator. CONTRAST:  70m OMNIPAQUE IOHEXOL 350 MG/ML SOLN [facility] FINDINGS: CTA NECK Aortic arch: Extensive atheromatous changes to the aortic arch. No dissection or visualized aneurysm. 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 Right carotid system: Diffuse atheromatous changes. The ICA has an irregular and flattened appearance with peripheral low-density appearance, suggesting dissection, likely chronic. Minimal lumen at the skullbase is 2 to 3 mm. Left carotid system: Diffuse atheromatous changes, predominately noncalcified with extensive plaque irregularity along the distal common carotid artery. Just beyond the bifurcation, there is graded appearance of the contrast with complete occlusion by the mid cervical ICA level.  Ophthalmic segment reconstitution described below Vertebral arteries:Dominant left vertebral artery has tandem stenoses of the V1 segment, high-grade at the distal V1 segment. Diffuse atheromatous narrowing of the left vertebral artery, moderate at the level of C3. Non dominant right vertebral artery origin high-grade stenosis. Other neck: Diffuse spondylosis and facet arthropathy. No acute/ contributory findings. 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 CTA HEAD Anterior circulation: Left ICA occlusion with ophthalmic segment reconstitution followed by a moderate supraclinoid ICA stenosis. There is multi focal left MCA stenoses, occluding high-grade anterior most left M2 branch. Diffuse irregularity of the right carotid siphon with multi focal moderate stenosis, greatest at the proximal petrous segment, ophthalmic segment, and supraclinoid segments. There is a fetal type right PCA. Inferior division right M1 -M2 vessel is abruptly truncated, correlating with a dense infarct. Posterior circulation: Non dominant right vertebral artery ends in the patent right PICA. The left vertebral artery serves the upper cerebellar arteries and left PCA given fetal type right PCA. At the left P2-P3 junction there is diffuse high-grade narrowing correlating with occipital infarct. Venous sinuses: Patent as permitted by contrast timing Anatomic variants: Fetal type right PCA Delayed phase: No parenchymal enhancement These results were called by telephone at the time of interpretation on 12/29/2014 at 12:45 pm to Dr. SLuanne Bras, who verbally acknowledged these results. Findings also discussed with Dr. RDoy Minceat 12:50 p.m. IMPRESSION: 1. Acute appearing occlusion of the left ICA from the cervical portion to the ophthalmic  segment. 2. Right M1 -M2 branch occlusion of the inferior division with completed infarct. 3. High-grade stenosis of the P2 and P3 segments with completed medial occipital lobe infarct. 4.  Diffuse moderate right ICA stenosis with appearance of dissection, favored chronic given low-density mural appearance. 5. High-grade stenosis of the bilateral V1 segments. 6. Extensive intracranial atherosclerosis with multi focal moderate stenosis. Electronically Signed   By: Monte Fantasia M.D.   On: 12/29/2014 12:58   Ct Cerebral Perfusion W/cm  12/29/2014  CLINICAL DATA:  Stroke-like symptoms. EXAM: CT CEREBRAL PERFUSION WITH CONTRAST TECHNIQUE: After bolus administration of intravenous contrast repeated imaging through the cerebral hemispheres was performed from the level of the sella nearly to the vertex. CONTRAST:  7m OMNIPAQUE IOHEXOL 350 MG/ML SOLN COMPARISON:  [facility] CT there is gray-white loss differentiation in the mesial left occipital lobe and in the posterior right temporal and right parietal lobes consistent with early infarct. No acute intracranial hemorrhage. No hydrocephalus or shift. There is prolonged transit time and decreased cerebral blood volume throughout the lateral right temporal lobe and right parietal lobe consistent with completed infarct. Matched decreased blood volume and prolonged transit time in the left occipital lobe consistent with acute PCA territory infarct. There is also asymmetrically prolonged transit time throughout the remainder of the left cerebral hemisphere, greatest along the watershed in this patient with left ICA occlusion. There is relatively preserved blood volume in this distribution compatible with tissue at risk. These results were called by telephone at the time of interpretation on 12/29/2014 at 12:41 pm to Dr. LAlexis Goodell, who verbally acknowledged these results. IMPRESSION: 1. Completed infarcts involving the inferior division right MCA territory and in the left PCA territory at the occipital lobe. Associated cytotoxic edema is newly seen since yesterday's CT. 2. Extensive left  cerebral ischemia/penumbra in the setting of left ICA occlusion. Electronically Signed   By: JMonte FantasiaM.D.   On: 12/29/2014 12:59    ASSESSMENT / PLAN:  NEUROLOGIC A:   Acute metabolic encephalopathy - due to sedation and acute CVA. New infarcts involving the inferior division right MCA and left PCA / ICA territory at the occipital lobe with associated cytotoxic edema - s/p neuro IR revascularization 10/26. Hx anxiety. P:   Sedation:  Propofol gtt / Fentanyl PRN. RASS goal: 0 to -1. Daily WUA. Neuro following. Stroke workup per neuro.  PULMONARY OETT 10/26 >>> A: VDRF due to inability to protect airway in the setting of acute CVA. 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 Hx COPD by report - no PFT's in system. P:   Full mechanical support, wean as able. VAP prevention measures. SBT in AM if able. CXR in AM. Need to discuss goals of care with family given that pt had refused chemo / XRT.  CARDIOVASCULAR A:  [facility] due to GI bleeding. Tight BP control - per neuro IR, goal BP 120 - 140. Hx CAD, HTN, [facility] A.fib, MI s/p PCI (on plavix and xarelto). P:  Trend troponins. EKG. Labetalol PRN. Hold outpatient ASA, xarelto given GI bleeding. Monitor hemodynamics. Hold outpatient coreg for now.  RENAL A:   Hypocalcemia. P:   1g Ca gluconate. NS @ 75. BMP in AM.  GASTROINTESTINAL A:   Acute blood loss  anemia due to GI bleed - s/p 4 PRBC transfusions at [facility]. GERD. Nutrition. P:   Monitor for further signs of GI bleeding. Follow H/H / transfusion guidelines as outlined below. Pantoprazole. NPO.  HEMATOLOGIC A:   Presumed lower GI bleeding - s/p 4 PRBC transfusions at [facility]. VTE Prophylaxis. P:  STAT H/H. Transfuse for Hgb < 7. SCD's. CBC in AM.  INFECTIOUS A:   No indication  of infection. P:   Monitor clinically.  ENDOCRINE A:   No acute issues.   P:   Monitor glucose on BMP.   Family updated: None.  Interdisciplinary Family Meeting v Palliative Care Meeting:  Due by: 11/1.   Montey Hora, [place] Pulmonary & Critical [facility] Medicine Pager: [phone]  or [phone] 12/29/2014, 1:56 PM  Attending Note:  69 year old male with PMH of prior CVA as well as squamous cell carcinoma of the lungs that they were unable to stage, he presents to [place] with weakness. History of etoh abuse as well and suspect variceal bleeding. Unsure how much GI has been called with severe anemia. Transferred to [facility] for Intervention for an acute CVA he suffered in [place]. Patient was intubated for the procedure and PCCM was consulted for vent management. Lungs with end exp wheezing noted. Will maintain with IVF, maintain BP per neuro recommendations. Pending mental status then will determine if GI is needed and if oncology is needed. Propofol for sedation. Check CXR and ABG now. Vent settings in place. PCCM will f/u in AM.  The patient is critically ill with multiple organ systems failure and requires high complexity decision making for assessment and support, frequent evaluation and titration of therapies, application of advanced monitoring technologies and extensive interpretation of multiple databases.   Critical Care Time devoted to patient care services described in this note is 35 Minutes. This time reflects time of care of this signee Dr Jennet Maduro. This critical care time does not reflect procedure time, or teaching time or supervisory time of PA/NP/Med student/Med Resident etc but could involve care discussion time.  Rush Farmer, M.D. [facility] Pulmonary/Critical Care Medicine. Pager: [phone]. After hours pager: [phone].

## 2014-12-29 NOTE — ED Notes (Signed)
Pt's wife at bedside, IR RN Magda Paganini at bedside getting report on patient for preparation for IR.

## 2014-12-29 NOTE — Anesthesia Procedure Notes (Addendum)
Procedure Name: Intubation Date/Time: 12/29/2014 1:45 PM Performed by: Layla Maw Pre-anesthesia Checklist: Patient identified, Suction available, Emergency Drugs available and Patient being monitored Patient Re-evaluated:Patient Re-evaluated prior to inductionOxygen Delivery Method: Circle System Utilized Preoxygenation: Pre-oxygenation with 100% oxygen Intubation Type: IV induction, Cricoid Pressure applied and Rapid sequence Ventilation: Unable to mask ventilate Laryngoscope Size: Miller and 3 Grade View: Grade III Tube type: Subglottic suction tube Tube size: 8.0 mm Number of attempts: 1 Airway Equipment and Method: Stylet Placement Confirmation: ETT inserted through vocal cords under direct vision,  positive ETCO2 and breath sounds checked- equal and bilateral Secured at: 23 cm Tube secured with: Tape Dental Injury: Teeth and Oropharynx as per pre-operative assessment

## 2014-12-29 NOTE — Anesthesia Preprocedure Evaluation (Addendum)
Anesthesia Evaluation  Patient identified by MRN, date of birth, ID band Patient awake    Reviewed: Allergy & Precautions, Patient's Chart, lab work & pertinent test results, Unable to perform ROS - Chart review onlyPreop documentation limited or incomplete due to emergent nature of procedure.  Airway        Dental  (+) Poor Dentition, Missing   Pulmonary COPD, Current Smoker,     + decreased breath sounds      Cardiovascular hypertension, + CAD, + Past MI and +CHF   Rhythm:Regular Rate:Tachycardia     Neuro/Psych PSYCHIATRIC DISORDERS Anxiety CVA    GI/Hepatic Neg liver ROS, GERD  ,  Endo/Other  negative endocrine ROS  Renal/GU negative Renal ROS  negative genitourinary   Musculoskeletal  (+) Arthritis ,   Abdominal   Peds negative pediatric ROS (+)  Hematology   Anesthesia Other Findings   Reproductive/Obstetrics negative OB ROS                           Lab Results  Component Value Date   HGB 10.9* 12/29/2014   HCT 32.0* 12/29/2014     Anesthesia Physical Anesthesia Plan  ASA: III and emergent  Anesthesia Plan: General   Post-op Pain Management:    Induction: Intravenous, Rapid sequence and Cricoid pressure planned  Airway Management Planned: Oral ETT  Additional Equipment: Arterial line  Intra-op Plan:   Post-operative Plan: Extubation in OR  Informed Consent: I have reviewed the patients History and Physical, chart, labs and discussed the procedure including the risks, benefits and alternatives for the proposed anesthesia with the patient or authorized representative who has indicated his/her understanding and acceptance.   History available from chart only and Only emergency history available  Plan Discussed with: CRNA  Anesthesia Plan Comments:        Anesthesia Quick Evaluation

## 2014-12-29 NOTE — Procedures (Signed)
S/P rt common carotid  Arteriogram,lt common carotid arteriogram,followed by co33mplete revascularization of LT ICA and LT MCA occlusion with x 1 pass with solitaire FR 4 mm x 40 mm retrieval device and '3mg'$  of of superselective  IA integrelin with restoration of TICI 3 flow

## 2014-12-29 NOTE — H&P (Addendum)
Admission H&P    Chief Complaint: Right sided weakness  HPI: Joseph Reed is an 69 y.o. male with multiple medical problems and a recent stroke 3 weeks ago.  Patient was on anticoagulation and was admitted to [facility] with a GI bleed.  Patient had to have all anticoagulation and antiplatelet therapy discontinued.  On yesterday evening was noted to have some right sided weakness.  This morning was found flaccid on the right and unable to communicate.  Patient transferred here for possible interventional procedure.    Date last known well: Date: 12/28/2014 Time last known well: Time: 22:00 tPA Given: No: Outside time window, recent infarct, GI bleed  Past Medical History  Diagnosis Date  . Coronary artery disease   . Hypertension   . CHF (congestive heart failure) ([place])   . 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 Hx of cardiac cath   . Valvular heart disease   . COPD (chronic obstructive pulmonary disease) ([place])   . Emphysema/COPD ([place])   . UTI (lower urinary tract infection)   . GERD (gastroesophageal reflux disease)   . Arthritis   . Cancer ([place])     lung- squamous cell carcinoma- IIIA  . Anemia   . Stroke ([place])     L sided weakness  . Anxiety     Past Surgical History  Procedure Laterality Date  . Tonsillectomy    . Angioplasty      stent april 2016  . Lung lobectomy      Family history: Unable to perform due to inability to communicate  Social History:  reports that he has been smoking.  He does not have any smokeless tobacco history on file. He reports that he does not drink alcohol or use illicit drugs.  Allergies:  Allergies  Allergen Reactions  . Tetracyclines & Related     Medications: Prior to Admission medications   Not on File    ROS: Unable to obtain  Physical Examination: Blood pressure 120/107, pulse 93, temperature 101.3 F (38.5 C), temperature source Core (Comment), resp. rate 25, height '6\' 1"'$  (1.854 m), weight 86.8 kg (191 lb 5.8 oz), SpO2 100  %.  General Examination: HEENT-  Normocephalic, no lesions, without obvious abnormality.  Normal external eye and conjunctiva.  Normal TM's bilaterally.  Normal auditory canals and external ears. Normal external nose, mucus membranes and septum.  Normal pharynx. Cardiovascular- S1, S2 normal, pulses palpable throughout   Lungs- chest clear, no wheezing, rales, normal symmetric air entry Abdomen- soft, non-tender; bowel sounds normal; no masses,  no organomegaly Extremities- LE edema Lymph-no adenopathy palpable Musculoskeletal-no joint tenderness, deformity or swelling Skin-necrotic toes  Neurological Examination Mental Status: Lethargic.  Follows simple commands.  Mute. Cranial Nerves: II: Discs flat bilaterally; Does not blink to confrontation from the left.  Pupils equal, round, reactive to light and accommodation III,IV, VI: ptosis not present, Intact oculocephalic maneuver V,VII: decreased right corneal VIII: hearing normal bilaterally IX,X: gag reflex reduced XI: Unable to test XII: midline tongue extension Motor: Lifts the left upper and lower extremity well against gravity.  Although able to lift the right upper a d lower extremity, lifts less well.   Sensory: Responds to pinprick and light touch throughout Deep Tendon Reflexes: 2+ and symmetric throughout Plantars: Right: upgoing   Left: upgoing Cerebellar: Unable to perform Gait: not tested due to safety concerns   Laboratory Studies:   Basic Metabolic Panel:  Recent Labs Lab 12/29/14 1245 12/30/14 0456  NA 136  135  K 3.9 4.1  CL 103 105  CO2  --  19*  GLUCOSE 158* 153*  BUN 19 15  CREATININE 1.20 1.19  CALCIUM  --  8.4*    Liver Function Tests: No results for input(s): AST, ALT, ALKPHOS, BILITOT, PROT, ALBUMIN in the last 168 hours. No results for input(s): LIPASE, AMYLASE in the last 168 hours. No results for input(s): AMMONIA in the last 168 hours.  CBC:  Recent Labs Lab 12/29/14 1245  12/29/14 1709 12/29/14 2211 12/30/14 0456  WBC  --   --   --  14.6*  HGB 10.9* 9.3* 9.7* 9.7*  HCT 32.0* 29.1* 30.7* 30.6*  MCV  --   --   --  90.0  PLT  --   --   --  312    Cardiac Enzymes:  Recent Labs Lab 12/29/14 1709 12/29/14 2211 12/30/14 0456  TROPONINI 0.32* 0.29* 0.30*    BNP: Invalid input(s): POCBNP  CBG: No results for input(s): GLUCAP in the last 168 hours.  Microbiology: Results for orders placed or performed during the hospital encounter of 12/29/14  MRSA PCR Screening     Status: None   Collection Time: 12/29/14  5:43 PM  Result Value Ref Range Status   MRSA by PCR NEGATIVE NEGATIVE Final    Comment:        The GeneXpert MRSA Assay (FDA approved for NASAL specimens only), is one component of a comprehensive MRSA colonization surveillance program. It is not intended to diagnose MRSA infection nor to guide or monitor treatment for MRSA infections.     Coagulation Studies: No results for input(s): LABPROT, INR in the last 72 hours.  Urinalysis: No results for input(s): COLORURINE, LABSPEC, PHURINE, GLUCOSEU, HGBUR, BILIRUBINUR, KETONESUR, PROTEINUR, UROBILINOGEN, NITRITE, LEUKOCYTESUR in the last 168 hours.  Invalid input(s): APPERANCEUR  Lipid Panel:     Component Value Date/Time   CHOL 143 12/30/2014 0453   TRIG 115 12/30/2014 0453   HDL 23* 12/30/2014 0453   CHOLHDL 6.2 12/30/2014 0453   VLDL 23 12/30/2014 0453   LDLCALC 97 12/30/2014 0453    HgbA1C: No results found for: HGBA1C  Urine Drug Screen:  No results found for: LABOPIA, COCAINSCRNUR, LABBENZ, AMPHETMU, THCU, LABBARB  Alcohol Level: No results for input(s): ETH in the last 168 hours.  Other results: EKG: atrial fibrillation, rate 118 bpm.  Imaging: Ct Angio Head W/cm &/or Wo Cm  12/29/2014  CLINICAL DATA:  New onset right-sided weakness and aphasia EXAM: CT ANGIOGRAPHY HEAD AND NECK TECHNIQUE: Multidetector CT imaging of the head and neck was performed using the  standard protocol during bolus administration of intravenous contrast. Multiplanar CT image reconstructions and MIPs were obtained to evaluate the vascular anatomy. Carotid stenosis measurements (when applicable) are obtained utilizing NASCET criteria, using the distal internal carotid diameter as the denominator. CONTRAST:  4m OMNIPAQUE IOHEXOL 350 MG/ML SOLN [facility]  [facility]INDINGS: CTA NECK Aortic arch: Extensive atheromatous changes to the aortic arch. No dissection or visualized aneurysm. 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 Right carotid system: Diffuse atheromatous changes. The ICA has an irregular and flattened appearance with peripheral low-density appearance, suggesting dissection, likely chronic. Minimal lumen at the skullbase is 2 to 3 mm. Left carotid system: Diffuse atheromatous changes, predominately noncalcified with extensive plaque irregularity along the distal common carotid artery. Just beyond the bifurcation, there is graded appearance of the contrast with complete occlusion by the mid cervical ICA level. Ophthalmic segment reconstitution  described below Vertebral arteries:Dominant left vertebral artery has tandem stenoses of the V1 segment, high-grade at the distal V1 segment. Diffuse atheromatous narrowing of the left vertebral artery, moderate at the level of C3. Non dominant right vertebral artery origin high-grade stenosis. Other neck: Diffuse spondylosis and facet arthropathy. No acute/ contributory findings. 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 CTA HEAD Anterior circulation: Left ICA occlusion with ophthalmic segment reconstitution followed by a moderate supraclinoid ICA stenosis. There is multi focal left MCA stenoses, occluding high-grade anterior most left M2 branch. Diffuse irregularity of the right carotid siphon with multi focal moderate stenosis, greatest at the proximal petrous segment, ophthalmic  segment, and supraclinoid segments. There is a fetal type right PCA. Inferior division right M1 -M2 vessel is abruptly truncated, correlating with a dense infarct. Posterior circulation: Non dominant right vertebral artery ends in the patent right PICA. The left vertebral artery serves the upper cerebellar arteries and left PCA given fetal type right PCA. At the left P2-P3 junction there is diffuse high-grade narrowing correlating with occipital infarct. Venous sinuses: Patent as permitted by contrast timing Anatomic variants: Fetal type right PCA Delayed phase: No parenchymal enhancement These results were called by telephone at the time of interpretation on 12/29/2014 at 12:45 pm to Dr. Luanne Bras , who verbally acknowledged these results. Findings also discussed with Dr. Doy Mince at 12:50 p.m. IMPRESSION: 1. Acute appearing occlusion of the left ICA from the cervical portion to the ophthalmic segment. 2. Right M1 -M2 branch occlusion of the inferior division with completed infarct. 3. High-grade stenosis of the P2 and P3 segments with completed medial occipital lobe infarct. 4. Diffuse moderate right ICA stenosis with appearance of dissection, favored chronic given low-density mural appearance. 5. High-grade stenosis of the bilateral V1 segments. 6. Extensive intracranial atherosclerosis with multi focal moderate stenosis. Electronically Signed   By: Monte Fantasia M.D.   On: 12/29/2014 12:58   Ct Head Wo Contrast  12/29/2014  CLINICAL DATA:  Acute onset of RIGHT-sided weakness and aphasia. History of atrial fibrillation. 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 EXAM: CT HEAD WITHOUT CONTRAST TECHNIQUE: Contiguous axial images were obtained from the base of the skull through the vertex without intravenous contrast. COMPARISON:  Multiple prior exams earlier today including CTA head neck and CT perfusion. CT head 12/28/2014. MR head 12/07/2014. FINDINGS: Intravascular blood pool related to  recent angiography. Acute RIGHT MCA lateral temporal lobe and parietal lobe infarcts are stable as demonstrated on earlier perfusion scan. Acute LEFT MCA medial occipital lobe infarct also stable, as demonstrated on earlier perfusion scan. No definite acute RIGHT MCA or ACA cortical infarction, although difficult to exclude subcortical or periventricular white matter ischemia given the background of pre-existing chronic microvascular ischemic change. No evidence for complication related to endovascular treatment, such as subarachnoid or parenchymal hemorrhage. Scattered areas of chronic lacunar infarction affecting the deep nuclei, cerebellum, and periventricular white matter are redemonstrated and stable. Luxury perfusion involving the cingulate gyrus on the RIGHT medial posterior frontal lobe reflecting subacute RIGHT ACA infarct which occurred earlier in October. IMPRESSION: No evidence for complication related endovascular treatment, such as subarachnoid or parenchymal hemorrhage. No evidence for cortical ischemia within the LEFT MCA or LEFT ACA territory. Acute RIGHT MCA and LEFT PCA infarcts appear unchanged as previously outlined on earlier perfusion scan. These have developed since previous MR 12/07/2014 and from previous CT of 12/28/2014. Electronically Signed   By: Staci Righter M.D.   On: 12/29/2014 16:13  Ct Angio Neck W/cm &/or Wo/cm  12/29/2014  CLINICAL DATA:  New onset right-sided weakness and aphasia EXAM: CT ANGIOGRAPHY HEAD AND NECK TECHNIQUE: Multidetector CT imaging of the head and neck was performed using the standard protocol during bolus administration of intravenous contrast. Multiplanar CT image reconstructions and MIPs were obtained to evaluate the vascular anatomy. Carotid stenosis measurements (when applicable) are obtained utilizing NASCET criteria, using the distal internal carotid diameter as the denominator. CONTRAST:  24m OMNIPAQUE IOHEXOL 350 MG/ML SOLN COMPARISON[facility] FINDINGS: CTA NECK Aortic arch: Extensive atheromatous changes to the aortic arch. No dissection or visualized aneurysm. 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 Right carotid system: Diffuse atheromatous changes. The ICA has an irregular and flattened appearance with peripheral low-density appearance, suggesting dissection, likely chronic. Minimal lumen at the skullbase is 2 to 3 mm. Left carotid system: Diffuse atheromatous changes, predominately noncalcified with extensive plaque irregularity along the distal common carotid artery. Just beyond the bifurcation, there is graded appearance of the contrast with complete occlusion by the mid cervical ICA level. Ophthalmic segment reconstitution described below Vertebral arteries:Dominant left vertebral artery has tandem stenoses of the V1 segment, high-grade at the distal V1 segment. Diffuse atheromatous narrowing of the left vertebral artery, moderate at the level of C3. Non dominant right vertebral artery origin high-grade stenosis. Other neck: Diffuse spondylosis and facet arthropathy. No acute/ contributory findings. 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 CTA HEAD Anterior circulation: Left ICA occlusion with ophthalmic segment reconstitution followed by a moderate supraclinoid ICA stenosis. There is multi focal left MCA stenoses, occluding high-grade anterior most left M2 branch. Diffuse irregularity of the right carotid siphon with multi focal moderate stenosis, greatest at the proximal petrous segment, ophthalmic segment, and supraclinoid segments. There is a fetal type right PCA. Inferior division right M1 -M2 vessel is abruptly truncated, correlating with a dense infarct. Posterior circulation: Non dominant right vertebral artery ends in the patent right PICA. The left vertebral artery serves the upper cerebellar arteries and left PCA given fetal type right PCA. At the left P2-P3 junction  there is diffuse high-grade narrowing correlating with occipital infarct. Venous sinuses: Patent as permitted by contrast timing Anatomic variants: Fetal type right PCA Delayed phase: No parenchymal enhancement These results were called by telephone at the time of interpretation on 12/29/2014 at 12:45 pm to Dr. SLuanne Bras, who verbally acknowledged these results. Findings also discussed with Dr. RDoy Minceat 12:50 p.m. IMPRESSION: 1. Acute appearing occlusion of the left ICA from the cervical portion to the ophthalmic segment. 2. Right M1 -M2 branch occlusion of the inferior division with completed infarct. 3. High-grade stenosis of the P2 and P3 segments with completed medial occipital lobe infarct. 4. Diffuse moderate right ICA stenosis with appearance of dissection, favored chronic given low-density mural appearance. 5. High-grade stenosis of the bilateral V1 segments. 6. Extensive intracranial atherosclerosis with multi focal moderate stenosis. Electronically Signed   By: JMonte FantasiaM.D.   On: 12/29/2014 12:58   Ct Cerebral Perfusion W/cm  12/29/2014  CLINICAL DATA:  Stroke-like symptoms. EXAM: CT CEREBRAL PERFUSION WITH CONTRAST TECHNIQUE: After bolus administration of intravenous contrast repeated imaging through the cerebral hemispheres was performed from the level of the sella nearly to the vertex. CONTRAST:  577mOMNIPAQUE IOHEXOL 350 MG/ML SOLN COMPARISON:  [place] [facility] [place] [facility] [facility] there is gray-white loss differentiation in the mesial left occipital lobe and  in the posterior right temporal and right parietal lobes consistent with early infarct. No acute intracranial hemorrhage. No hydrocephalus or shift. There is prolonged transit time and decreased cerebral blood volume throughout the lateral right temporal lobe and right parietal lobe consistent with completed infarct. Matched decreased blood volume and prolonged transit time in the  left occipital lobe consistent with acute PCA territory infarct. There is also asymmetrically prolonged transit time throughout the remainder of the left cerebral hemisphere, greatest along the watershed in this patient with left ICA occlusion. There is relatively preserved blood volume in this distribution compatible with tissue at risk. These results were called by telephone at the time of interpretation on 12/29/2014 at 12:41 pm to Dr. Alexis Goodell , who verbally acknowledged these results. IMPRESSION: 1. Completed infarcts involving the inferior division right MCA territory and in the left PCA territory at the occipital lobe. Associated cytotoxic edema is newly seen since yesterday's CT. 2. Extensive left cerebral ischemia/penumbra in the setting of left ICA occlusion. Electronically Signed   By: Monte Fantasia M.D.   On: 12/29/2014 12:59   Dg Chest Portable 1 View  12/29/2014  CLINICAL DATA:  Endotracheal tube placement. EXAM: PORTABLE CHEST 1 VIEW COMPARISON:  12/26/2014 FINDINGS: Endotracheal tube is in place, tip 5.8 cm above carina. Nasogastric tube has been placed, tip beyond the gastroesophageal junction and off the image. Side port is at the level of the gastroesophageal junction. There is new near complete opacity of the left hemithorax. There is increased mediastinal shift towards the left. Surgical clips in the left hilar region consistent with previous partial lobectomy. Suspect left pleural effusion in addition to atelectasis or infiltrate. The right lung is hyperexpanded and clear. IMPRESSION: 1. New significant opacity in the left lung consisting of left pleural effusion and atelectasis or infiltrate. 2. Postoperative changes in the left lung. 3. Placement of endotracheal tube and nasogastric tube. Electronically Signed   By: Nolon Nations M.D.   On: 12/29/2014 16:57    Assessment: 69 y.o. male with acute onset right sided weakness and aphasia.  Onset of current symptoms unclear and  although patient not a tPA candidate, may be a candidate for thrombectomy.  IR has been made aware.    Stroke Risk Factors - atrial fibrillation and hypertension  Plan: 1. CT perfusion  Alexis Goodell, MD [facility] [phone] 12/30/2014, 6:55 AM  Addendum: Perfusion study shows large left MCA penumbra.  Thrombectomy indicated.  IR aware.  CCM consulted for ventilator management.  Plan: 1. HgbA1c, fasting lipid panel 2. MRI, MRA  of the brain without contrast 3. PT consult, OT consult, Speech consult 4. Echocardiogram 5. Carotid dopplers 6. NPO until RN stroke swallow screen 7. Telemetry monitoring 8. Frequent neuro checks   This patient is critically ill and at significant risk of neurological worsening, death and care requires constant monitoring of vital signs, hemodynamics,respiratory and cardiac monitoring, neurological assessment, discussion with family, other specialists and medical decision making of high complexity. I spent 75 minutes of neurocritical care time  in the care of  this patient.  Alexis Goodell, MD [facility] [phone] 12/30/2014  6:55 AM

## 2014-12-29 NOTE — Progress Notes (Signed)
Notified by radiology that patients Chest X-ray shows a new near complete opacity of the left hemithorax. Discussed with CCM, Dr Halford Chessman, who raised concern over possible mucous plug and reports he will address. Appreciate CCM input.

## 2014-12-30 ENCOUNTER — Inpatient Hospital Stay (HOSPITAL_COMMUNITY): Payer: Medicare Other

## 2014-12-30 ENCOUNTER — Encounter (HOSPITAL_COMMUNITY): Payer: Self-pay | Admitting: Interventional Radiology

## 2014-12-30 DIAGNOSIS — T17500A Unspecified foreign body in bronchus causing asphyxiation, initial encounter: Secondary | ICD-10-CM | POA: Insufficient documentation

## 2014-12-30 DIAGNOSIS — K5793 Diverticulitis of intestine, part unspecified, without perforation or abscess with bleeding: Secondary | ICD-10-CM | POA: Insufficient documentation

## 2014-12-30 DIAGNOSIS — J9690 Respiratory failure, unspecified, unspecified whether with hypoxia or hypercapnia: Secondary | ICD-10-CM | POA: Insufficient documentation

## 2014-12-30 DIAGNOSIS — I6789 Other cerebrovascular disease: Secondary | ICD-10-CM

## 2014-12-30 DIAGNOSIS — I6529 Occlusion and stenosis of unspecified carotid artery: Secondary | ICD-10-CM | POA: Insufficient documentation

## 2014-12-30 DIAGNOSIS — I6523 Occlusion and stenosis of bilateral carotid arteries: Secondary | ICD-10-CM

## 2014-12-30 DIAGNOSIS — I6340 Cerebral infarction due to embolism of unspecified cerebral artery: Secondary | ICD-10-CM

## 2014-12-30 DIAGNOSIS — R739 Hyperglycemia, unspecified: Secondary | ICD-10-CM

## 2014-12-30 DIAGNOSIS — J9809 Other diseases of bronchus, not elsewhere classified: Secondary | ICD-10-CM

## 2014-12-30 DIAGNOSIS — I672 Cerebral atherosclerosis: Secondary | ICD-10-CM

## 2014-12-30 LAB — BASIC METABOLIC PANEL
ANION GAP: 11 (ref 5–15)
BUN: 15 mg/dL (ref 6–20)
CHLORIDE: 105 mmol/L (ref 101–111)
CO2: 19 mmol/L — AB (ref 22–32)
Calcium: 8.4 mg/dL — ABNORMAL LOW (ref 8.9–10.3)
Creatinine, Ser: 1.19 mg/dL (ref 0.61–1.24)
GFR calc Af Amer: >60 mL/min (ref >60)
GLUCOSE: 153 mg/dL — AB (ref 65–99)
POTASSIUM: 4.1 mmol/L (ref 3.5–5.1)
Sodium: 135 mmol/L (ref 135–145)

## 2014-12-30 LAB — CBC
HEMATOCRIT: 30.6 % — AB (ref 39.0–52.0)
HEMOGLOBIN: 9.7 g/dL — AB (ref 13.0–17.0)
MCH: 28.5 pg (ref 26.0–34.0)
MCHC: 31.7 g/dL (ref 30.0–36.0)
MCV: 90 fL (ref 78.0–100.0)
PLATELETS: 312 10*3/uL (ref 150–400)
RBC: 3.4 MIL/uL — AB (ref 4.22–5.81)
RDW: 14.8 % (ref 11.5–15.5)
WBC: 14.6 10*3/uL — AB (ref 4.0–10.5)

## 2014-12-30 LAB — LIPID PANEL
CHOL/HDL RATIO: 6.2 ratio
CHOLESTEROL: 143 mg/dL (ref 0–200)
HDL: 23 mg/dL — AB (ref >40)
LDL Cholesterol: 97 mg/dL (ref 0–99)
TRIGLYCERIDES: 115 mg/dL (ref <150)
VLDL: 23 mg/dL (ref 0–40)

## 2014-12-30 LAB — HEMOGLOBIN AND HEMATOCRIT, BLOOD
HEMATOCRIT: 27.8 % — AB (ref 39.0–52.0)
HEMOGLOBIN: 8.9 g/dL — AB (ref 13.0–17.0)

## 2014-12-30 LAB — TROPONIN I: Troponin I: 0.3 ng/mL — ABNORMAL HIGH (ref <0.031)

## 2014-12-30 LAB — GLUCOSE, CAPILLARY
GLUCOSE-CAPILLARY: 116 mg/dL — AB (ref 65–99)
GLUCOSE-CAPILLARY: 128 mg/dL — AB (ref 65–99)
Glucose-Capillary: 161 mg/dL — ABNORMAL HIGH (ref 65–99)

## 2014-12-30 LAB — PROCALCITONIN

## 2014-12-30 MED ORDER — ALBUTEROL SULFATE (2.5 MG/3ML) 0.083% IN NEBU
INHALATION_SOLUTION | RESPIRATORY_TRACT | Status: AC
  Filled 2014-12-30: qty 3

## 2014-12-30 MED ORDER — ANTISEPTIC ORAL RINSE SOLUTION (CORINZ)
7.0000 mL | Freq: Four times a day (QID) | OROMUCOSAL | Status: DC
  Administered 2014-12-30 – 2014-12-31 (×8): 7 mL via OROMUCOSAL

## 2014-12-30 MED ORDER — INSULIN ASPART 100 UNIT/ML ~~LOC~~ SOLN
0.0000 [IU] | SUBCUTANEOUS | Status: DC
  Administered 2014-12-30: 2 [IU] via SUBCUTANEOUS
  Administered 2014-12-30 – 2015-01-05 (×12): 1 [IU] via SUBCUTANEOUS

## 2014-12-30 MED ORDER — PANTOPRAZOLE SODIUM 40 MG IV SOLR
40.0000 mg | Freq: Two times a day (BID) | INTRAVENOUS | Status: DC
Start: 2014-12-30 — End: 2015-01-05
  Administered 2014-12-30 – 2015-01-05 (×12): 40 mg via INTRAVENOUS
  Filled 2014-12-30 (×13): qty 40

## 2014-12-30 MED ORDER — ENOXAPARIN SODIUM 40 MG/0.4ML ~~LOC~~ SOLN
40.0000 mg | SUBCUTANEOUS | Status: DC
  Administered 2014-12-30 – 2014-12-31 (×2): 40 mg via SUBCUTANEOUS
  Filled 2014-12-30 (×2): qty 0.4

## 2014-12-30 MED ORDER — CHLORHEXIDINE GLUCONATE 0.12% ORAL RINSE (MEDLINE KIT)
15.0000 mL | Freq: Two times a day (BID) | OROMUCOSAL | Status: DC
  Administered 2014-12-30 – 2015-01-05 (×13): 15 mL via OROMUCOSAL
  Filled 2014-12-30: qty 15

## 2014-12-30 MED ORDER — ATORVASTATIN CALCIUM 80 MG PO TABS
80.0000 mg | ORAL_TABLET | Freq: Every day | ORAL | Status: DC
  Administered 2014-12-30 – 2015-01-01 (×3): 80 mg via ORAL
  Filled 2014-12-30 (×5): qty 1

## 2014-12-30 MED ORDER — ASPIRIN 325 MG PO TABS
325.0000 mg | ORAL_TABLET | Freq: Every day | ORAL | Status: DC
  Administered 2014-12-30 – 2015-01-02 (×4): 325 mg via ORAL
  Filled 2014-12-30 (×7): qty 1

## 2014-12-30 MED ORDER — ALBUTEROL SULFATE (2.5 MG/3ML) 0.083% IN NEBU
2.5000 mg | INHALATION_SOLUTION | Freq: Four times a day (QID) | RESPIRATORY_TRACT | Status: AC
  Administered 2014-12-30 – 2014-12-31 (×4): 2.5 mg via RESPIRATORY_TRACT
  Filled 2014-12-30 (×3): qty 3

## 2014-12-30 NOTE — Progress Notes (Signed)
Pt remains on full support tolerating well.  Plan for MRI at 1100 today.  Spoke with MD, will hold off on weaning trials today, plan is to rest pt on full support.  Rt will monitor

## 2014-12-30 NOTE — Progress Notes (Signed)
SLP Cancellation Note  Patient Details Name: Ezzard Ditmer MRN: [id number] DOB: [birth year]   Cancelled treatment:       Reason Eval/Treat Not Completed: Medical issues which prohibited therapy (Patient on ventilator. Will f/u 10/28. )  Gabriel Rainwater [place], [place] [phone]  Brelynn Wheller [place] 12/30/2014, 8:10 AM

## 2014-12-30 NOTE — Care Management Note (Signed)
Case Management Note  Patient Details  Name: Joseph Reed MRN: [id number] Date of Birth: 08-May-1945  Subjective/Objective:     Pt admitted on 12/29/14 with Lt MCA infarct s/p endovascular procedure.  PTA, pt resides at home with spouse.               Action/Plan: Pt currently remains on vent, full support.  Will follow for discharge planning as pt progresses.   Expected Discharge Date:                  Expected Discharge Plan:  [place]  In-House Referral:     Discharge planning Services  CM Consult  Post Acute Care Choice:    Choice offered to:     DME Arranged:    DME Agency:     HH Arranged:    [place] Agency:     Status of Service:  In process, will continue to follow  Medicare Important Message Given:    Date Medicare IM Given:    Medicare IM give by:    Date Additional Medicare IM Given:    Additional Medicare Important Message give by:     If discussed at [place] of Stay Meetings, dates discussed:    Additional Comments:  Reinaldo Raddle, RN, BSN  Trauma/Neuro ICU Case Manager [phone]

## 2014-12-30 NOTE — Progress Notes (Signed)
PT Cancellation Note  Patient Details Name: Diarra Kos MRN: [id number] DOB: Jun 23, 1945   Cancelled Treatment:    Reason Eval/Treat Not Completed: Medical issues which prohibited therapy (Pt on bedrest and to be in rest mode per MD.  Will check back tomorrow.  Thanks.)   Irwin Brakeman F 12/30/2014, 9:38 AM  [facility] [phone] (pager)

## 2014-12-30 NOTE — Progress Notes (Signed)
Referring Physician(s): Dr Erlinda Hong  Chief Complaint: CVA S/P rt common carotid Arteriogram,lt common carotid arteriogram,followed by complete revascularization of LT ICA and LT MCA occlusion with x 1 pass with solitaire FR 4 mm x 40 mm retrieval device and '3mg'$  of of superselective IA integrelin with restoration of TICI 3 flow  Subjective:  Intubated Does reach for tubing with Rt arm per RN No other movement noted Does not respond to me  Allergies: Tetracyclines & related  Medications: Prior to Admission medications   Not on File     Vital Signs: BP 135/85 mmHg  Pulse 104  Temp(Src) 100.4 F (38 C) (Core (Comment))  Resp 17  Ht '6\' 1"'$  (1.854 m)  Wt 191 lb 5.8 oz (86.8 kg)  BMI 25.25 kg/m2  SpO2 100%  Physical Exam  Musculoskeletal:  Rt groin NT No bleeding No hematoma Rt foot pulses + with doppler  Nursing note and vitals reviewed.   Imaging: Ct Angio Head W/cm &/or Wo Cm  12/29/2014  CLINICAL DATA:  New onset right-sided weakness and aphasia EXAM: CT ANGIOGRAPHY HEAD AND NECK TECHNIQUE: Multidetector CT imaging of the head and neck was performed using the standard protocol during bolus administration of intravenous contrast. Multiplanar CT image reconstructions and MIPs were obtained to evaluate the vascular anatomy. Carotid stenosis measurements (when applicable) are obtained utilizing NASCET criteria, using the distal internal carotid diameter as the denominator. CONTRAST:  16m OMNIPAQUE IOHEXOL 350 MG/ML SOLN COM[facility]  [facility] FINDINGS: CTA NECK Aortic arch: Extensive atheromatous changes to the aortic arch. No dissection or visualized aneurysm. 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 Right carotid system: Diffuse atheromatous changes. The ICA has an irregular and flattened appearance with peripheral low-density appearance, suggesting dissection, likely chronic. Minimal lumen at the skullbase is 2 to 3 mm. Left  carotid system: Diffuse atheromatous changes, predominately noncalcified with extensive plaque irregularity along the distal common carotid artery. Just beyond the bifurcation, there is graded appearance of the contrast with complete occlusion by the mid cervical ICA level. Ophthalmic segment reconstitution described below Vertebral arteries:Dominant left vertebral artery has tandem stenoses of the V1 segment, high-grade at the distal V1 segment. Diffuse atheromatous narrowing of the left vertebral artery, moderate at the level of C3. Non dominant right vertebral artery origin high-grade stenosis. Other neck: Diffuse spondylosis and facet arthropathy. No acute/ contributory findings. 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 CTA HEAD Anterior circulation: Left ICA occlusion with ophthalmic segment reconstitution followed by a moderate supraclinoid ICA stenosis. There is multi focal left MCA stenoses, occluding high-grade anterior most left M2 branch. Diffuse irregularity of the right carotid siphon with multi focal moderate stenosis, greatest at the proximal petrous segment, ophthalmic segment, and supraclinoid segments. There is a fetal type right PCA. Inferior division right M1 -M2 vessel is abruptly truncated, correlating with a dense infarct. Posterior circulation: Non dominant right vertebral artery ends in the patent right PICA. The left vertebral artery serves the upper cerebellar arteries and left PCA given fetal type right PCA. At the left P2-P3 junction there is diffuse high-grade narrowing correlating with occipital infarct. Venous sinuses: Patent as permitted by contrast timing Anatomic variants: Fetal type right PCA Delayed phase: No parenchymal enhancement These results were called by telephone at the time of interpretation on 12/29/2014 at 12:45 pm to Dr. SLuanne Bras, who verbally acknowledged these results. Findings also discussed with Dr. RDoy Minceat 12:50 p.m. IMPRESSION: 1.  Acute appearing occlusion  of the left ICA from the cervical portion to the ophthalmic segment. 2. Right M1 -M2 branch occlusion of the inferior division with completed infarct. 3. High-grade stenosis of the P2 and P3 segments with completed medial occipital lobe infarct. 4. Diffuse moderate right ICA stenosis with appearance of dissection, favored chronic given low-density mural appearance. 5. High-grade stenosis of the bilateral V1 segments. 6. Extensive intracranial atherosclerosis with multi focal moderate stenosis. Electronically Signed   By: Monte Fantasia M.D.   On: 12/29/2014 12:58   Ct Head Wo Contrast  12/29/2014  CLINICAL DATA:  Acute onset of RIGHT-sided weakness and aphasia. History of atrial fibrillation. 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 EXAM: CT HEAD WITHOUT CONTRAST TECHNIQUE: Contiguous axial images were obtained from the base of the skull through the vertex without intravenous contrast. COMPARISON:  Multiple prior exams earlier today including CTA head neck and CT perfusion. CT head 12/28/2014. MR head 12/07/2014. FINDINGS: Intravascular blood pool related to recent angiography. Acute RIGHT MCA lateral temporal lobe and parietal lobe infarcts are stable as demonstrated on earlier perfusion scan. Acute LEFT MCA medial occipital lobe infarct also stable, as demonstrated on earlier perfusion scan. No definite acute RIGHT MCA or ACA cortical infarction, although difficult to exclude subcortical or periventricular white matter ischemia given the background of pre-existing chronic microvascular ischemic change. No evidence for complication related to endovascular treatment, such as subarachnoid or parenchymal hemorrhage. Scattered areas of chronic lacunar infarction affecting the deep nuclei, cerebellum, and periventricular white matter are redemonstrated and stable. Luxury perfusion involving the cingulate gyrus on the RIGHT medial posterior frontal lobe reflecting subacute RIGHT ACA  infarct which occurred earlier in October. IMPRESSION: No evidence for complication related endovascular treatment, such as subarachnoid or parenchymal hemorrhage. No evidence for cortical ischemia within the LEFT MCA or LEFT ACA territory. Acute RIGHT MCA and LEFT PCA infarcts appear unchanged as previously outlined on earlier perfusion scan. These have developed since previous MR 12/07/2014 and from previous CT of 12/28/2014. Electronically Signed   By: Staci Righter M.D.   On: 12/29/2014 16:13   Ct Angio Neck W/cm &/or Wo/cm  12/29/2014  CLINICAL DATA:  New onset right-sided weakness and aphasia EXAM: CT ANGIOGRAPHY HEAD AND NECK TECHNIQUE: Multidetector CT imaging of the head and neck was performed using the standard protocol during bolus administration of intravenous contrast. Multiplanar CT image reconstructions and MIPs were obtained to evaluate the vascular anatomy. Carotid stenosis measurements (when applicable) are obtained utilizing NASCET criteria, using the distal internal carotid diameter as the denominator. CONTRAST:  3m OMNIPAQUE IOHEXOL 350 MG/ML SOLN COMPARISON:  [facility] FINDINGS: CTA NECK Aortic arch: Extensive atheromatous changes to the aortic arch. No dissection or visualized aneurysm. 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 Right carotid system: Diffuse atheromatous changes. The ICA has an irregular and flattened appearance with peripheral low-density appearance, suggesting dissection, likely chronic. Minimal lumen at the skullbase is 2 to 3 mm. Left carotid system: Diffuse atheromatous changes, predominately noncalcified with extensive plaque irregularity along the distal common carotid artery. Just beyond the bifurcation, there is graded appearance of the contrast with complete occlusion by the mid cervical ICA level. Ophthalmic segment reconstitution described below Vertebral arteries:Dominant left vertebral artery has tandem stenoses  of the V1 segment, high-grade at the distal V1 segment. Diffuse atheromatous narrowing of the left vertebral artery, moderate at the level of C3. Non dominant right vertebral artery origin high-grade stenosis. Other neck: Diffuse spondylosis and facet arthropathy. No  acute/ contributory findings. 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 CTA HEAD Anterior circulation: Left ICA occlusion with ophthalmic segment reconstitution followed by a moderate supraclinoid ICA stenosis. There is multi focal left MCA stenoses, occluding high-grade anterior most left M2 branch. Diffuse irregularity of the right carotid siphon with multi focal moderate stenosis, greatest at the proximal petrous segment, ophthalmic segment, and supraclinoid segments. There is a fetal type right PCA. Inferior division right M1 -M2 vessel is abruptly truncated, correlating with a dense infarct. Posterior circulation: Non dominant right vertebral artery ends in the patent right PICA. The left vertebral artery serves the upper cerebellar arteries and left PCA given fetal type right PCA. At the left P2-P3 junction there is diffuse high-grade narrowing correlating with occipital infarct. Venous sinuses: Patent as permitted by contrast timing Anatomic variants: Fetal type right PCA Delayed phase: No parenchymal enhancement These results were called by telephone at the time of interpretation on 12/29/2014 at 12:45 pm to Dr. Luanne Bras , who verbally acknowledged these results. Findings also discussed with Dr. Doy Mince at 12:50 p.m. IMPRESSION: 1. Acute appearing occlusion of the left ICA from the cervical portion to the ophthalmic segment. 2. Right M1 -M2 branch occlusion of the inferior division with completed infarct. 3. High-grade stenosis of the P2 and P3 segments with completed medial occipital lobe infarct. 4. Diffuse moderate right ICA stenosis with appearance of dissection, favored chronic given low-density mural appearance. 5.  High-grade stenosis of the bilateral V1 segments. 6. Extensive intracranial atherosclerosis with multi focal moderate stenosis. Electronically Signed   By: Monte Fantasia M.D.   On: 12/29/2014 12:58   Ct Cerebral Perfusion W/cm  12/29/2014  CLINICAL DATA:  Stroke-like symptoms. EXAM: CT CEREBRAL PERFUSION WITH CONTRAST TECHNIQUE: After bolus administration of intravenous contrast repeated imaging through the cerebral hemispheres was performed from the level of the sella nearly to the vertex. CONTRAST:  75m OMNIPAQUE IOHEXOL 350 MG/ML SOLN COMPARISON:  [place] CT there is gray-white loss differentiation in the mesial left occipital lobe and in the posterior right temporal and right parietal lobes consistent with early infarct. No acute intracranial hemorrhage. No hydrocephalus or shift. There is prolonged transit time and decreased cerebral blood volume throughout the lateral right temporal lobe and right parietal lobe consistent with completed infarct. Matched decreased blood volume and prolonged transit time in the left occipital lobe consistent with acute PCA territory infarct. There is also asymmetrically prolonged transit time throughout the remainder of the left cerebral hemisphere, greatest along the watershed in this patient with left ICA occlusion. There is relatively preserved blood volume in this distribution compatible with tissue at risk. These results were called by telephone at the time of interpretation on 12/29/2014 at 12:41 pm to Dr. LAlexis Goodell, who verbally acknowledged these results. IMPRESSION: 1. Completed infarcts involving the inferior division right MCA territory and in the left PCA territory at the occipital lobe. Associated cytotoxic edema is newly seen since yesterday's CT. 2. Extensive left cerebral ischemia/penumbra in the setting of left ICA occlusion. Electronically Signed   By: JMonte FantasiaM.D.   On:  12/29/2014 12:59   Dg Chest Port 1 View  12/30/2014  CLINICAL DATA:  Respiratory failure. EXAM: PORTABLE CHEST 1 VIEW COMPARISON:  12/29/2014 FINDINGS: Endotracheal tubes terminates at the inferior margin of the clavicular heads. Enteric tube courses into the left upper abdomen with tip not imaged. There is improved aeration of the left  lung with decreased leftward mediastinal shift. Dense opacity remains in the left lung base, and a small left pleural effusion is suspected. Right lung remains clear. No pneumothorax is identified. Thoracic spondylosis is noted. IMPRESSION: Improved aeration of the left lung. Persistent dense consolidation or atelectasis in the left lower lobe and likely small left pleural effusion. Electronically Signed   By: Logan Bores M.D.   On: 12/30/2014 08:02   Dg Chest Portable 1 View  12/29/2014  CLINICAL DATA:  Endotracheal tube placement. EXAM: PORTABLE CHEST 1 VIEW COMPARISON:  12/26/2014 FINDINGS: Endotracheal tube is in place, tip 5.8 cm above carina. Nasogastric tube has been placed, tip beyond the gastroesophageal junction and off the image. Side port is at the level of the gastroesophageal junction. There is new near complete opacity of the left hemithorax. There is increased mediastinal shift towards the left. Surgical clips in the left hilar region consistent with previous partial lobectomy. Suspect left pleural effusion in addition to atelectasis or infiltrate. The right lung is hyperexpanded and clear. IMPRESSION: 1. New significant opacity in the left lung consisting of left pleural effusion and atelectasis or infiltrate. 2. Postoperative changes in the left lung. 3. Placement of endotracheal tube and nasogastric tube. Electronically Signed   By: Nolon Nations M.D.   On: 12/29/2014 16:57    Labs:  CBC:  Recent Labs  12/29/14 1245 12/29/14 1709 12/29/14 2211 12/30/14 0456  WBC  --   --   --  14.6*  HGB 10.9* 9.3* 9.7* 9.7*  HCT 32.0* 29.1* 30.7* 30.6*    PLT  --   --   --  312    COAGS: No results for input(s): INR, APTT in the last 8760 hours.  BMP:  Recent Labs  12/29/14 1245 12/30/14 0456  NA 136 135  K 3.9 4.1  CL 103 105  CO2  --  19*  GLUCOSE 158* 153*  BUN 19 15  CALCIUM  --  8.4*  CREATININE 1.20 1.19  GFRNONAA  --  >60  GFRAA  --  >60    LIVER FUNCTION TESTS: No results for input(s): BILITOT, AST, ALT, ALKPHOS, PROT, ALBUMIN in the last 8760 hours.  Assessment and Plan:  CVA L ICA and L MCA clot retrieval; IA Integrilin 10/26 Intubated on vent No response Will report to Dr Pierce Crane  Signed: Monia Sabal A 12/30/2014, 9:03 AM   I spent a total of 15 Minutes at the the patient's bedside AND on the patient's hospital floor or unit, greater than 50% of which was counseling/coordinating care for CVA; clot retrieval L ICA/MCA

## 2014-12-30 NOTE — Plan of Care (Signed)
Problem: Acute Treatment Outcomes Goal: tPA Patient w/o S&S of bleeding Outcome: Not Applicable Date Met:  73/95/84 tPA not given

## 2014-12-30 NOTE — Progress Notes (Signed)
OT Cancellation Note  Patient Details Name: Joseph Reed MRN: [id number] DOB: 1945/08/11   Cancelled Treatment:    Reason Eval/Treat Not Completed: Other (comment);Patient not medically ready Currently on bedrest [facility], OTR/L  [phone] 12/30/2014 12/30/2014, 9:28 AM

## 2014-12-30 NOTE — Progress Notes (Signed)
PULMONARY / CRITICAL CARE MEDICINE   Name: Joseph Reed MRN: [id number] DOB: [birth year]    ADMISSION DATE:  12/29/2014 CONSULTATION DATE:  12/29/14  REFERRING MD :  Estanislado Pandy  CHIEF COMPLAINT:  CVA  INITIAL PRESENTATION:  69 y.o. M admitted to [facility] 10/26 with generalized weakness.  10/26, developed right sided weakness and inability to follow commands.  Found to have acute CVA, transferred to [facility], intubated and taken for IR revascularization.  STUDIES:  CT cerebral perfusion 10/26 - completed infarcts involving the inferior division right MCA and left PCA territory at the occipital lobe.  Associated cytotoxic edema noted.  Extensive left cerebral ischemia in the setting of left ICA occlusion. CTA head / neck 10/26 - acute appearing occlusion of left ICA.  Right M1 - M2 branch occlusion with complete infarct.  High grade stenosis of P2 and P3 segments.  Diffuse moderate right ICA stenosis. Portable CXR 10/27 - Improving aeration left upper lobe compared with prior chest x-ray.  SIGNIFICANT EVENTS: 10/23 - admitted to [facility] with generalized weakness. 10/26 - transferred to [facility] for acute CVA.  Taken to IR for revascularization.  SUBJECTIVE: Patient had a fever this morning. Despite being off propofol was following no commands at the time of my exam. Per nursing report patient is scheduled for MRI this morning.  REVIEW OF SYSTEMS:  Unable to obtain as patient is intubated & remains altered.  VITAL SIGNS: Temp:  [98.1 F (36.7 C)-101.3 F (38.5 C)] 100.4 F (38 C) (10/27 0900) Pulse Rate:  [76-116] 100 (10/27 0900) Resp:  [8-25] 16 (10/27 0900) BP: (91-163)/(46-139) 129/81 mmHg (10/27 0900) SpO2:  [89 %-100 %] 100 % (10/27 0900) Arterial Line BP: (77-177)/(50-83) 77/73 mmHg (10/27 0900) FiO2 (%):  [40 %-100 %] 40 % (10/27 0843) Weight:  [191 lb 5.8 oz (86.8 kg)] 191 lb 5.8 oz (86.8 kg) (10/26 1600) HEMODYNAMICS:   VENTILATOR SETTINGS: Vent Mode:  [-] PRVC FiO2 (%):   [40 %-100 %] 40 % Set Rate:  [14 bmp] 14 bmp Vt Set:  [600 mL] 600 mL PEEP:  [5 cmH20] 5 cmH20 Plateau Pressure:  [8 cmH20-22 cmH20] 22 cmH20 INTAKE / OUTPUT: Intake/Output      10/26 0701 - 10/27 0700 10/27 0701 - 10/28 0700   I.V. (mL/kg) 2237.3 (25.8)    Total Intake(mL/kg) 2237.3 (25.8)    Urine (mL/kg/hr) 1055    Total Output 1055     Net +1182.3            PHYSICAL EXAMINATION: General:  Sedated. No acute distress. Laying in bed on ventilator.  Integument:  Warm & dry. No rash on exposed skin.  HEENT:  No scleral injection or icterus. Endotracheal tube in place. PERRL. Cardiovascular:  Regular rate. No edema. No appreciable JVD.  Pulmonary:  Coarse breath sounds bilaterally. Symmetric chest wall rise on ventilator. Abdomen: Soft. Normal bowel sounds. Nondistended.  Neurological: Off sedation patient does not follow commands. Spontaneously moves all 4 extremities. Reflexes 3+ and symmetric. Patient does reach for the endotracheal tube with his right hand.  LABS:  CBC  Recent Labs Lab 12/29/14 1709 12/29/14 2211 12/30/14 0456  WBC  --   --  14.6*  HGB 9.3* 9.7* 9.7*  HCT 29.1* 30.7* 30.6*  PLT  --   --  312   Coag's No results for input(s): APTT, INR in the last 168 hours. BMET  Recent Labs Lab 12/29/14 1245 12/30/14 0456  NA 136 135  K 3.9 4.1  CL 103 105  CO2  --  19*  BUN 19 15  CREATININE 1.20 1.19  GLUCOSE 158* 153*   Electrolytes  Recent Labs Lab 12/30/14 0456  CALCIUM 8.4*   Sepsis Markers No results for input(s): LATICACIDVEN, PROCALCITON, O2SATVEN in the last 168 hours. ABG  Recent Labs Lab 12/29/14 1608  PHART 7.426  PCO2ART 34.2*  PO2ART 450*   Liver Enzymes No results for input(s): AST, ALT, ALKPHOS, BILITOT, ALBUMIN in the last 168 hours. Cardiac Enzymes  Recent Labs Lab 12/29/14 1709 12/29/14 2211 12/30/14 0456  TROPONINI 0.32* 0.29* 0.30*   Glucose No results for input(s): GLUCAP in the last 168  hours.  Imaging Ct Angio Head W/cm &/or Wo Cm  12/29/2014  CLINICAL DATA:  New onset right-sided weakness and aphasia EXAM: CT ANGIOGRAPHY HEAD AND NECK TECHNIQUE: Multidetector CT imaging of the head and neck was performed using the standard protocol during bolus administration of intravenous contrast. Multiplanar CT image reconstructions and MIPs were obtained to evaluate the vascular anatomy. Carotid stenosis measurements (when applicable) are obtained utilizing NASCET criteria, using the distal internal carotid diameter as the denominator. CONTRAST:  75m OMNIPAQUE IOHEXOL 350 MG/ML SOLN [facility] FINDINGS: CTA NECK Aortic arch: Extensive atheromatous changes to the aortic arch. No dissection or visualized aneurysm. 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 Right carotid system: Diffuse atheromatous changes. The ICA has an irregular and flattened appearance with peripheral low-density appearance, suggesting dissection, likely chronic. Minimal lumen at the skullbase is 2 to 3 mm. Left carotid system: Diffuse atheromatous changes, predominately noncalcified with extensive plaque irregularity along the distal common carotid artery. Just beyond the bifurcation, there is graded appearance of the contrast with complete occlusion by the mid cervical ICA level. Ophthalmic segment reconstitution described below Vertebral arteries:Dominant left vertebral artery has tandem stenoses of the V1 segment, high-grade at the distal V1 segment. Diffuse atheromatous narrowing of the left vertebral artery, moderate at the level of C3. Non dominant right vertebral artery origin high-grade stenosis. Other neck: Diffuse spondylosis and facet arthropathy. No acute/ contributory findings. 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 CTA HEAD Anterior circulation: Left ICA occlusion with ophthalmic segment reconstitution followed by a moderate supraclinoid ICA  stenosis. There is multi focal left MCA stenoses, occluding high-grade anterior most left M2 branch. Diffuse irregularity of the right carotid siphon with multi focal moderate stenosis, greatest at the proximal petrous segment, ophthalmic segment, and supraclinoid segments. There is a fetal type right PCA. Inferior division right M1 -M2 vessel is abruptly truncated, correlating with a dense infarct. Posterior circulation: Non dominant right vertebral artery ends in the patent right PICA. The left vertebral artery serves the upper cerebellar arteries and left PCA given fetal type right PCA. At the left P2-P3 junction there is diffuse high-grade narrowing correlating with occipital infarct. Venous sinuses: Patent as permitted by contrast timing Anatomic variants: Fetal type right PCA Delayed phase: No parenchymal enhancement These results were called by telephone at the time of interpretation on 12/29/2014 at 12:45 pm to Dr. SLuanne Bras, who verbally acknowledged these results. Findings also discussed with Dr. RDoy Minceat 12:50 p.m. IMPRESSION: 1. Acute appearing occlusion of the left ICA from the cervical portion to the ophthalmic segment. 2. Right M1 -M2 branch occlusion of the inferior division with completed infarct. 3. High-grade stenosis of the P2 and P3 segments with completed medial occipital lobe infarct. 4. Diffuse moderate right ICA stenosis with appearance of dissection, favored chronic given low-density  mural appearance. 5. High-grade stenosis of the bilateral V1 segments. 6. Extensive intracranial atherosclerosis with multi focal moderate stenosis. Electronically Signed   By: Monte Fantasia M.D.   On: 12/29/2014 12:58   Ct Head Wo Contrast  12/29/2014  CLINICAL DATA:  Acute onset of RIGHT-sided weakness and aphasia. History of atrial fibrillation. 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 EXAM: CT HEAD WITHOUT CONTRAST TECHNIQUE: Contiguous axial images were obtained from the base of  the skull through the vertex without intravenous contrast. COMPARISON:  Multiple prior exams earlier today including CTA head neck and CT perfusion. CT head 12/28/2014. MR head 12/07/2014. FINDINGS: Intravascular blood pool related to recent angiography. Acute RIGHT MCA lateral temporal lobe and parietal lobe infarcts are stable as demonstrated on earlier perfusion scan. Acute LEFT MCA medial occipital lobe infarct also stable, as demonstrated on earlier perfusion scan. No definite acute RIGHT MCA or ACA cortical infarction, although difficult to exclude subcortical or periventricular white matter ischemia given the background of pre-existing chronic microvascular ischemic change. No evidence for complication related to endovascular treatment, such as subarachnoid or parenchymal hemorrhage. Scattered areas of chronic lacunar infarction affecting the deep nuclei, cerebellum, and periventricular white matter are redemonstrated and stable. Luxury perfusion involving the cingulate gyrus on the RIGHT medial posterior frontal lobe reflecting subacute RIGHT ACA infarct which occurred earlier in October. IMPRESSION: No evidence for complication related endovascular treatment, such as subarachnoid or parenchymal hemorrhage. No evidence for cortical ischemia within the LEFT MCA or LEFT ACA territory. Acute RIGHT MCA and LEFT PCA infarcts appear unchanged as previously outlined on earlier perfusion scan. These have developed since previous MR 12/07/2014 and from previous CT of 12/28/2014. Electronically Signed   By: Staci Righter M.D.   On: 12/29/2014 16:13   Ct Angio Neck W/cm &/or Wo/cm  12/29/2014  CLINICAL DATA:  New onset right-sided weakness and aphasia EXAM: CT ANGIOGRAPHY HEAD AND NECK TECHNIQUE: Multidetector CT imaging of the head and neck was performed using the standard protocol during bolus administration of intravenous contrast. Multiplanar CT image reconstructions and MIPs were obtained to evaluate the  vascular anatomy. Carotid stenosis measurements (when applicable) are obtained utilizing NASCET criteria, using the distal internal carotid diameter as the denominator. CONTRAST:  5m OMNIPAQUE IOHEXOL 350 MG/ML SOLN [facility]INDINGS: CTA NECK Aortic arch: Extensive atheromatous changes to the aortic arch. No dissection or visualized aneurysm. 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 Right carotid system: Diffuse atheromatous changes. The ICA has an irregular and flattened appearance with peripheral low-density appearance, suggesting dissection, likely chronic. Minimal lumen at the skullbase is 2 to 3 mm. Left carotid system: Diffuse atheromatous changes, predominately noncalcified with extensive plaque irregularity along the distal common carotid artery. Just beyond the bifurcation, there is graded appearance of the contrast with complete occlusion by the mid cervical ICA level. Ophthalmic segment reconstitution described below Vertebral arteries:Dominant left vertebral artery has tandem stenoses of the V1 segment, high-grade at the distal V1 segment. Diffuse atheromatous narrowing of the left vertebral artery, moderate at the level of C3. Non dominant right vertebral artery origin high-grade stenosis. Other neck: Diffuse spondylosis and facet arthropathy. No acute/ contributory findings. 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 CTA HEAD Anterior circulation: Left ICA occlusion with ophthalmic segment reconstitution followed by a moderate supraclinoid ICA stenosis. There is multi focal left MCA stenoses, occluding high-grade anterior most left M2 branch. Diffuse irregularity of the right carotid siphon with multi  focal moderate stenosis, greatest at the proximal petrous segment, ophthalmic segment, and supraclinoid segments. There is a fetal type right PCA. Inferior division right M1 -M2 vessel is abruptly truncated, correlating with a  dense infarct. Posterior circulation: Non dominant right vertebral artery ends in the patent right PICA. The left vertebral artery serves the upper cerebellar arteries and left PCA given fetal type right PCA. At the left P2-P3 junction there is diffuse high-grade narrowing correlating with occipital infarct. Venous sinuses: Patent as permitted by contrast timing Anatomic variants: Fetal type right PCA Delayed phase: No parenchymal enhancement These results were called by telephone at the time of interpretation on 12/29/2014 at 12:45 pm to Dr. Luanne Bras , who verbally acknowledged these results. Findings also discussed with Dr. Doy Mince at 12:50 p.m. IMPRESSION: 1. Acute appearing occlusion of the left ICA from the cervical portion to the ophthalmic segment. 2. Right M1 -M2 branch occlusion of the inferior division with completed infarct. 3. High-grade stenosis of the P2 and P3 segments with completed medial occipital lobe infarct. 4. Diffuse moderate right ICA stenosis with appearance of dissection, favored chronic given low-density mural appearance. 5. High-grade stenosis of the bilateral V1 segments. 6. Extensive intracranial atherosclerosis with multi focal moderate stenosis. Electronically Signed   By: Monte Fantasia M.D.   On: 12/29/2014 12:58   Ct Cerebral Perfusion W/cm  12/29/2014  CLINICAL DATA:  Stroke-like symptoms. EXAM: CT CEREBRAL PERFUSION WITH CONTRAST TECHNIQUE: After bolus administration of intravenous contrast repeated imaging through the cerebral hemispheres was performed from the level of the sella nearly to the vertex. CONTRAST:  27m OMNIPAQUE IOHEXOL 350 MG/ML SOLN COMPARISON:  [facility] head CT there is gray-white loss differentiation in the mesial left occipital lobe and in the posterior right temporal and right parietal lobes consistent with early infarct. No acute intracranial hemorrhage. No hydrocephalus or shift.  There is prolonged transit time and decreased cerebral blood volume throughout the lateral right temporal lobe and right parietal lobe consistent with completed infarct. Matched decreased blood volume and prolonged transit time in the left occipital lobe consistent with acute PCA territory infarct. There is also asymmetrically prolonged transit time throughout the remainder of the left cerebral hemisphere, greatest along the watershed in this patient with left ICA occlusion. There is relatively preserved blood volume in this distribution compatible with tissue at risk. These results were called by telephone at the time of interpretation on 12/29/2014 at 12:41 pm to Dr. LAlexis Goodell, who verbally acknowledged these results. IMPRESSION: 1. Completed infarcts involving the inferior division right MCA territory and in the left PCA territory at the occipital lobe. Associated cytotoxic edema is newly seen since yesterday's CT. 2. Extensive left cerebral ischemia/penumbra in the setting of left ICA occlusion. Electronically Signed   By: JMonte FantasiaM.D.   On: 12/29/2014 12:59   Dg Chest Port 1 View  12/30/2014  CLINICAL DATA:  Respiratory failure. EXAM: PORTABLE CHEST 1 VIEW COMPARISON:  12/29/2014 FINDINGS: Endotracheal tubes terminates at the inferior margin of the clavicular heads. Enteric tube courses into the left upper abdomen with tip not imaged. There is improved aeration of the left lung with decreased leftward mediastinal shift. Dense opacity remains in the left lung base, and a small left pleural effusion is suspected. Right lung remains clear. No pneumothorax is identified. Thoracic spondylosis is noted. IMPRESSION: Improved aeration of the left lung. Persistent dense consolidation or atelectasis in the left lower lobe and likely small  left pleural effusion. Electronically Signed   By: Logan Bores M.D.   On: 12/30/2014 08:02   Dg Chest Portable 1 View  12/29/2014  CLINICAL DATA:  Endotracheal  tube placement. EXAM: PORTABLE CHEST 1 VIEW COMPARISON:  12/26/2014 FINDINGS: Endotracheal tube is in place, tip 5.8 cm above carina. Nasogastric tube has been placed, tip beyond the gastroesophageal junction and off the image. Side port is at the level of the gastroesophageal junction. There is new near complete opacity of the left hemithorax. There is increased mediastinal shift towards the left. Surgical clips in the left hilar region consistent with previous partial lobectomy. Suspect left pleural effusion in addition to atelectasis or infiltrate. The right lung is hyperexpanded and clear. IMPRESSION: 1. New significant opacity in the left lung consisting of left pleural effusion and atelectasis or infiltrate. 2. Postoperative changes in the left lung. 3. Placement of endotracheal tube and nasogastric tube. Electronically Signed   By: Nolon Nations M.D.   On: 12/29/2014 16:57    ASSESSMENT / PLAN:  NEUROLOGIC A:   Acute metabolic encephalopathy - due to sedation and acute CVA. New infarcts involving the inferior division right MCA and left PCA / ICA territory at the occipital lobe with associated cytotoxic edema - s/p neuro IR revascularization 10/26. H/O anxiety  P:   Further workup & MRI per Neurology Sedation:  Propofol gtt & Fentanyl PRN. RASS goal: 0 to -1 S/P Integrilin Daily WUA.  PULMONARY OETT 10/26 >>> A: Acute Respiratory Failure - due to inability to protect airway in the setting of acute CVA. Mucus Plugging - Improving. 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 H/O COPD by report - no PFT's in system  P:   Holding on SBT pending improvement in mental status SBT in AM if able. Repeat Portable CXR in AM. Continuing Chest PT Continuing Mucomyst & Albuterol nebs q6hr for airway clearance  CARDIOVASCULAR A:  Troponin I Elevation - likely demand ischemia due to GI bleeding. Tight BP  control - per neuro IR, goal BP 120 - 140. H/O HTN & CHF (Echo from 10/23 at [place] with EF 30-35%) H/O Atrial Fibrillation H/O CAD & MI s/p PCI (on plavix and xarelto)  P:  BP parameters per Neuro & IR Continuing to hold outpatient ASA & Xarleto Holding Coreg for now Labetalol IV prn Monitoring patient on Telemetry  RENAL A:   Hypocalcemia - Mild. S/P Calcium gluconate.  P:   NS @ 75cc/hr Monitoring UOP with foley catheter Monitoring renal function with daily BUN/Creatinine  GASTROINTESTINAL A:   Acute blood loss anemia due to GI bleed - s/p 4 PRBC transfusions at [facility]. H/O GERD  P:   Monitor for further signs of GI bleeding. Follow Hgb & transfusion guidelines as outlined below. Switching Protonix to IV q12hr Holding on GI consult pending improvement in mental status NPO.  HEMATOLOGIC A:   Anemia - Presumed lower GI bleeding - s/p 4 PRBC transfusions at [facility]. No active bleeding seen. Leukocytosis - Reactive versus Sepsis given recent fever & intervention VTE Prophylaxis  P:  Trending Hgb q12hr Transfuse for Hgb < 7. SCD's. Holding chemical DVT prophylaxis  INFECTIOUS A:   Fever to 101.79F this AM  P:   Procalcitonin algorithm Holding on antibiotics at this time Trending leukocytosis  Respiratory Ctx 10/27>>> Blood Ctx 10/27>>> Urine Ctx 10/27>>>  ENDOCRINE A:   Hyperglycemia   P:  Starting Accuchecks q4hr  Low dose SSI coverage   Family updated: No family at bedside to update this morning.  Interdisciplinary Family Meeting v Palliative Care Meeting:  Due by: 11/1.  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 Patient underwent embolectomy on 10/26. This morning he does remain altered despite his sedation vacation. Given his hyperglycemia on his basic metabolic panel I am starting Accu-Cheks. As the patient has previously declined chemotherapy & XRT  with lung cancer depending upon his mental status goals of care may need to be readdressed. The patient has had no further signs of GI blood loss at this time. Plan for spontaneous breathing trial & sedation vacation tomorrow morning. Chest x-ray does seem to be improving with regards to the mucus plugging noted previously. We will continue with airway clearance maneuvers.  I have spent a total of 32 minutes of critical care time this morning caring for this patient and reviewing his electronic medical record.  Sonia Baller Ashok Cordia, M.D. [facility] Pager:  [phone] After 3pm or if no response, call [phone]  12/30/2014, 10:04 AM

## 2014-12-30 NOTE — Progress Notes (Signed)
STROKE TEAM PROGRESS NOTE   HISTORY Joseph Reed is an 69 y.o. male with multiple medical problems and a recent stroke 3 weeks ago. Patient was on anticoagulation and was admitted to [facility] with a GI bleed. Patient had to have all anticoagulation and antiplatelet therapy discontinued. On yesterday evening was noted to have some right sided weakness (LKW 12/28/2014 at 2200). The next morning (12/28/2014) he was found flaccid on the right and unable to communicate. Patient transferred to [facility] for possible interventional procedure. Patient was not considered for TPA secondary to being outside the time window, recent infarct, GI bleed. Under IRB protocol, pt was taken to IR for a right and left common carotidfollowed by complete revascularization of LT ICA and LT MCA occlusion with x 1 pass with solitaire retrieval device and '3mg'$  of of superselective IA integrelin with restoration of TICI 3 flow.  Hewas admitted to the neuro ICU for further evaluation and treatment.   SUBJECTIVE (INTERVAL HISTORY) His wife and son are at the bedside.  Overall he feels his condition is unchanged. He is intubated and on propofol. Once off propofol, he is not open eyes and not following commands. He moves right arm and leg spontaneously, but not on the left.   As per family, he had MI in 06/2014, s/p cardiac stent, after cath, he had brain MRI showed small punctate infarcts at brainstem and right hemisphere. He had stroke involving left arm and leg 3 weeks ago, found to have afib and put on plavix and Xarelto. He took Xarelto last dose two weeks ago but then developed LGIB, hb 5.8 and required admission to [place] getting 4 PRBCs.   He is smoker and found to have lung cancer with local lymph nodes involvement. Not able to get metastasis work up yet. He declined chemo and radiation due to recent MI and stroke, and he was weak.   Yesterday, he developed right LE weakness and aphasia. His left handed. CT  and CTP concerning for right MCA and left PCA infarct. CTA showed left ICA occlusion. IR performed mechanical thrombectomy and removed left ICA clot. Pt was intubated since then.     OBJECTIVE Temp:  [98.1 F (36.7 C)-101.3 F (38.5 C)] 100.4 F (38 C) (10/27 0800) Pulse Rate:  [76-116] 104 (10/27 0831) Cardiac Rhythm:  [-] Atrial fibrillation (10/27 0600) Resp:  [8-25] 17 (10/27 0831) BP: (91-163)/(46-139) 135/85 mmHg (10/27 0831) SpO2:  [89 %-100 %] 100 % (10/27 0831) Arterial Line BP: (101-177)/(50-83) 117/61 mmHg (10/27 0800) FiO2 (%):  [40 %-100 %] 40 % (10/27 0843) Weight:  [86.8 kg (191 lb 5.8 oz)] 86.8 kg (191 lb 5.8 oz) (10/26 1600)  CBC:  Recent Labs Lab 12/29/14 2211 12/30/14 0456  WBC  --  14.6*  HGB 9.7* 9.7*  HCT 30.7* 30.6*  MCV  --  90.0  PLT  --  765    Basic Metabolic Panel:  Recent Labs Lab 12/29/14 1245 12/30/14 0456  NA 136 135  K 3.9 4.1  CL 103 105  CO2  --  19*  GLUCOSE 158* 153*  BUN 19 15  CREATININE 1.20 1.19  CALCIUM  --  8.4*    Lipid Panel:    Component Value Date/Time   CHOL 143 12/30/2014 0453   TRIG 115 12/30/2014 0453   HDL 23* 12/30/2014 0453   CHOLHDL 6.2 12/30/2014 0453   VLDL 23 12/30/2014 0453   LDLCALC 97 12/30/2014 0453   HgbA1c: No results found for: HGBA1C Urine Drug  Screen: No results found for: LABOPIA, COCAINSCRNUR, LABBENZ, AMPHETMU, THCU, LABBARB    IMAGING I have personally reviewed the radiological images below and agree with the radiology interpretations.  Ct Head Wo Contrast 12/29/2014   No evidence for complication related endovascular treatment, such as subarachnoid or parenchymal hemorrhage. No evidence for cortical ischemia within the LEFT MCA or LEFT ACA territory. Acute RIGHT MCA and LEFT PCA infarcts appear unchanged as previously outlined on earlier perfusion scan.   Ct Angio Head & Neck W/cm &/or Wo/cm 12/29/2014   1. Acute appearing occlusion of the left ICA from the cervical portion to the  ophthalmic segment. 2. Right M1 -M2 branch occlusion of the inferior division with completed infarct. 3. High-grade stenosis of the P2 and P3 segments with completed medial occipital lobe infarct. 4. Diffuse moderate right ICA stenosis with appearance of dissection, favored chronic given low-density mural appearance. 5. High-grade stenosis of the bilateral V1 segments. 6. Extensive intracranial atherosclerosis with multi focal moderate stenosis.   Ct Cerebral Perfusion W/cm 12/29/2014  1. Completed infarcts involving the inferior division right MCA territory and in the left PCA territory at the occipital lobe. Associated cytotoxic edema is newly seen since yesterday's CT. 2. Extensive left cerebral ischemia/penumbra in the setting of left ICA occlusion.   Dg Chest Port 1 View 12/30/2014  Improved aeration of the left lung. Persistent dense consolidation or atelectasis in the left lower lobe and likely small left pleural effusion.  12/29/2014   1. New significant opacity in the left lung consisting of left pleural effusion and atelectasis or infiltrate. 2. Postoperative changes in the left lung. 3. Placement of endotracheal tube and nasogastric tube.   R & L common carotidarteriogram LT ICA and LT MCA occlusion with recannulization TICI 3  2D echo - pending  MRI and MRA brain - pending  PHYSICAL EXAM  Temp:  [98.1 F (36.7 C)-101.3 F (38.5 C)] 100.4 F (38 C) (10/27 1100) Pulse Rate:  [76-114] 107 (10/27 1139) Resp:  [8-25] 18 (10/27 1139) BP: (91-162)/(46-139) 135/56 mmHg (10/27 1139) SpO2:  [89 %-100 %] 100 % (10/27 1139) Arterial Line BP: (77-177)/(50-83) 163/64 mmHg (10/27 1100) FiO2 (%):  [40 %-100 %] 40 % (10/27 1139) Weight:  [191 lb 5.8 oz (86.8 kg)] 191 lb 5.8 oz (86.8 kg) (10/26 1600)  General - Well nourished, well developed, intubated, just off propofol.  Ophthalmologic - Fundi not visualized due to small pupils.  Cardiovascular - irregularly irregular heart rate and  rhythm, afib with RVR.  Neuro - intubated, just off propofol, eyes closed, not following commands. PERRL, positive corneal and gag, not blink to visual threat with forced eye opening. Mildly pale. On pain stimulation, pt spontaneously moving right UE and LE, against gravity, but trace withdraw LUE and 2/5 LLE. Babinski negative, DTR 1+.    ASSESSMENT/PLAN Mr. Anthonny Schiller is a 69 y.o. male with history of MI s/p stenting in 06/2014, HTN, smoker, lung  Cancer, recent stroke found to have afib on Xarelto and plavix but pt stopped Xarelto two weeks ago, recent LGIB admitted to [facility] for blood transfusion, transferred to [facility] from [place] for aphasia and right LE weakness.  S/p complete revascularization of LT ICA occlusion.    Stroke:  R MCA, L PCA infarcts, showing on CTP. S/P L ICA TICI3 revascularization. Stroke etiology likely due to afib not on anticoagulation due to GIB. However, pt does have significant large vessel athero, which could be etiology for stroke too. However, his initial right  side weakness with aphasia did not fit to the stroke identified by CT, needs MRI for further evaluation.  Resultant  Intubated with left sided weakness  MRI and MRA pending  CTA head and neck and CTP - left ICA occlusion, right MCA infarct and left PCA infarct but left MCA and ACA are open  Angio - left ICA occlusion with TICI3 recannulization  2D Echo  pending   LDL 97  HgbA1c pending  lovenox for VTE prophylaxis  Diet NPO time specified  clopidogrel 75 mg daily and Xarelto (rivaroxaban) daily prior to admission for atrial fibrillation, now on ASA '325mg'$ .  Ongoing aggressive stroke risk factor management  Therapy recommendations:  pending   Disposition:  pending   History of stroke  06/2014 - after cardiac cath, MRI showed two punctate infarct - brainstem and right parietal WM  12/07/14 - small right ACA and punctate right peri-ventricle   Intracranial  atherosclerosis  Left ICA occlusion s/p recannulization  Right ICA diffuse stenosis  Right M2 occlusion  Left P2 occlusion  Bilateral VA stenosis  Diffuse intracranial athero with multi focal moderate stenosis  Atrial Fibrillation  Home anticoagulation:  xarelto and plavix  Pt stopped Xarelto by himself prior to GIB  Hold off AC due to large stroke and recent GIB requiring blood transfusion  HR 110s  Monitoring HR, may consider Cardizem if RVR  Lung cancer  Squamous cell lung cancer s/p LLL lobectomy 09/2013 - pt was supposed to have post op chemo / XRT but he apparently declined.  With adjacent lymph node involvement  Staging not done yet  Left lung atelectasis  Overnight CXR comfirmed  On chest PT, suctioning, albuterol and mucomyst  Repeat CXR better aeration   CCM on board  LGIB  Hgb 5.8 at [place], S/p 4u PRBCs at [facility]  Suspected variceal bleeding  Taking motrin bid for arthritis pain prior to admission to [place]  Hb stable so far  currently on ASA for stroke   Close monitoring Hb and GIB   Respiratory Failure  Intubated for neuro intervention  CXR with near complete opacity with Mucous plug  Do not extubate today  CAD with MI s/p stent  On plavix prior to [place]  Had MI in 06/2014 s/p PCI and stent  Tobacco abuse  Current smoker  Hypertension  Stable on the low side Permissive hypertension (OK if < 220/120) but gradually normalize in 5-7 days  Hyperlipidemia  Home meds:  unclear  LDL 97, goal < 70  Put on lipitor 80  continue statin at discharge  Other Stroke Risk Factors  Advanced age  hx ETOH abuse  Hx CHF  Other Active Problems  Hx COPD, emphysema  Troponin elevation, felt to be demand ischemia due to stroke  Hypocalcemia  Febrile, TMax 101.3  Hospital day # 1  This patient is critically ill due to embolic stroke, left ICA occlusion, right ICA stenosis, afib with RVR, GIB requiring blood  transfusion, lung cancer, lung atelectasis, respiratory failure and at significant risk of neurological worsening, death form hemorrhagic transformation, recurrent infarct, heart failure, brain herniation, shock and sepsis. This patient's care requires constant monitoring of vital signs, hemodynamics, respiratory and cardiac monitoring, review of multiple databases, neurological assessment, discussion with family, other specialists and medical decision making of high complexity. I discussed with family at bedside extensively, showed them images, updated about his condition, treatment and prognosis. I spent 50 minutes of neurocritical care time in the care of this patient.   Rashema Seawright  Erlinda Hong, MD PhD Stroke Neurology 12/30/2014 12:46 PM      To contact Stroke Continuity provider, please refer to [email or web address] After hours, contact General Neurology

## 2014-12-30 NOTE — Progress Notes (Signed)
  Echocardiogram 2D Echocardiogram has been performed.  Bobbye Charleston 12/30/2014, 10:41 AM

## 2014-12-31 ENCOUNTER — Inpatient Hospital Stay (HOSPITAL_COMMUNITY): Payer: Medicare Other

## 2014-12-31 DIAGNOSIS — J189 Pneumonia, unspecified organism: Secondary | ICD-10-CM

## 2014-12-31 DIAGNOSIS — K5793 Diverticulitis of intestine, part unspecified, without perforation or abscess with bleeding: Secondary | ICD-10-CM

## 2014-12-31 LAB — GLUCOSE, CAPILLARY
GLUCOSE-CAPILLARY: 137 mg/dL — AB (ref 65–99)
Glucose-Capillary: 103 mg/dL — ABNORMAL HIGH (ref 65–99)
Glucose-Capillary: 107 mg/dL — ABNORMAL HIGH (ref 65–99)
Glucose-Capillary: 115 mg/dL — ABNORMAL HIGH (ref 65–99)
Glucose-Capillary: 125 mg/dL — ABNORMAL HIGH (ref 65–99)
Glucose-Capillary: 125 mg/dL — ABNORMAL HIGH (ref 65–99)
Glucose-Capillary: 134 mg/dL — ABNORMAL HIGH (ref 65–99)

## 2014-12-31 LAB — CBC WITH DIFFERENTIAL/PLATELET
Basophils Absolute: 0 10*3/uL (ref 0.0–0.1)
Basophils Relative: 0 %
EOS PCT: 1 %
Eosinophils Absolute: 0.1 10*3/uL (ref 0.0–0.7)
HCT: 26.8 % — ABNORMAL LOW (ref 39.0–52.0)
HEMOGLOBIN: 8.4 g/dL — AB (ref 13.0–17.0)
LYMPHS ABS: 1.5 10*3/uL (ref 0.7–4.0)
LYMPHS PCT: 13 %
MCH: 28.1 pg (ref 26.0–34.0)
MCHC: 31.3 g/dL (ref 30.0–36.0)
MCV: 89.6 fL (ref 78.0–100.0)
Monocytes Absolute: 1.1 10*3/uL — ABNORMAL HIGH (ref 0.1–1.0)
Monocytes Relative: 9 %
NEUTROS PCT: 77 %
Neutro Abs: 8.8 10*3/uL — ABNORMAL HIGH (ref 1.7–7.7)
Platelets: 273 10*3/uL (ref 150–400)
RBC: 2.99 MIL/uL — AB (ref 4.22–5.81)
RDW: 15.1 % (ref 11.5–15.5)
WBC: 11.5 10*3/uL — AB (ref 4.0–10.5)

## 2014-12-31 LAB — RENAL FUNCTION PANEL
Albumin: 2.2 g/dL — ABNORMAL LOW (ref 3.5–5.0)
Anion gap: 8 (ref 5–15)
BUN: 14 mg/dL (ref 6–20)
CHLORIDE: 106 mmol/L (ref 101–111)
CO2: 23 mmol/L (ref 22–32)
Calcium: 8.3 mg/dL — ABNORMAL LOW (ref 8.9–10.3)
Creatinine, Ser: 1.12 mg/dL (ref 0.61–1.24)
GFR calc Af Amer: >60 mL/min (ref >60)
Glucose, Bld: 127 mg/dL — ABNORMAL HIGH (ref 65–99)
POTASSIUM: 3.6 mmol/L (ref 3.5–5.1)
Phosphorus: 2.7 mg/dL (ref 2.5–4.6)
Sodium: 137 mmol/L (ref 135–145)

## 2014-12-31 LAB — HEMOGLOBIN A1C
Hgb A1c MFr Bld: 5.8 % — ABNORMAL HIGH (ref 4.8–5.6)
Mean Plasma Glucose: 120 mg/dL

## 2014-12-31 LAB — TRIGLYCERIDES: Triglycerides: 91 mg/dL (ref <150)

## 2014-12-31 LAB — URINE CULTURE
CULTURE: NO GROWTH
SPECIAL REQUESTS: NORMAL

## 2014-12-31 LAB — PROCALCITONIN: Procalcitonin: 0.13 ng/mL

## 2014-12-31 LAB — HEMOGLOBIN AND HEMATOCRIT, BLOOD
HEMATOCRIT: 26.8 % — AB (ref 39.0–52.0)
Hemoglobin: 8.2 g/dL — ABNORMAL LOW (ref 13.0–17.0)

## 2014-12-31 LAB — MAGNESIUM: Magnesium: 1.9 mg/dL (ref 1.7–2.4)

## 2014-12-31 MED ORDER — CEFTRIAXONE SODIUM 2 G IJ SOLR
2.0000 g | INTRAMUSCULAR | Status: DC
  Administered 2014-12-31 – 2015-01-05 (×6): 2 g via INTRAVENOUS
  Filled 2014-12-31 (×6): qty 2

## 2014-12-31 MED ORDER — INFLUENZA VAC SPLIT QUAD 0.5 ML IM SUSY
0.5000 mL | PREFILLED_SYRINGE | INTRAMUSCULAR | Status: AC
  Administered 2015-01-01: 0.5 mL via INTRAMUSCULAR
  Filled 2014-12-31: qty 0.5

## 2014-12-31 MED ORDER — ANTISEPTIC ORAL RINSE SOLUTION (CORINZ)
7.0000 mL | OROMUCOSAL | Status: DC
  Administered 2015-01-01 – 2015-01-05 (×41): 7 mL via OROMUCOSAL

## 2014-12-31 MED ORDER — ALBUTEROL SULFATE (2.5 MG/3ML) 0.083% IN NEBU
2.5000 mg | INHALATION_SOLUTION | RESPIRATORY_TRACT | Status: DC | PRN
  Administered 2014-12-31 – 2015-01-02 (×7): 2.5 mg via RESPIRATORY_TRACT
  Filled 2014-12-31 (×8): qty 3

## 2014-12-31 NOTE — Progress Notes (Signed)
Initial Nutrition Assessment  DOCUMENTATION CODES:   Severe malnutrition in context of chronic illness  INTERVENTION:  If pt remains intubated recommend:  Initiate Vital AF 1.2 @ 15 ml/hr via OG tube and increase by 10 ml every 8 hours to goal rate of 75 ml/hr.   Tube feeding regimen provides 2160 kcal, 135 grams of protein, and 1504 ml of H2O.   Recommend: monitor magnesium, potassium, and phosphorus daily for at least 3 days, MD to replete as needed, as pt is at risk for refeeding syndrome given severe malnutrition.   NUTRITION DIAGNOSIS:   Malnutrition related to chronic illness as evidenced by severe depletion of body fat, severe depletion of muscle mass, energy intake < or equal to 75% for > or equal to 1 month, 13 percent weight loss in last 6 months.   GOAL:   Patient will meet greater than or equal to 90% of their needs  MONITOR:   Skin, I & O's, Labs, Vent status, TF tolerance  REASON FOR ASSESSMENT:   Ventilator    ASSESSMENT:   68 y.o. M admitted to [facility] 10/26 with generalized weakness. 10/26, developed right sided weakness and inability to follow commands. Found to have acute CVA, transferred to [facility], intubated and taken for IR revascularization.New infarcts involving the inferior division right MCA and left PCA / ICA territory at the occipital lobe with associated cytotoxic edema - s/p neuro IR revascularization 10/26. 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  Patient is currently intubated on ventilator support MV: 9 L/min Temp (24hrs), Avg:100 F (37.8 C), Min:99.1 F (37.3 C), Max:101.1 F (38.4 C)  Wife at bedside and reports 40 lb weight loss in 06/2014 (intial cancer dx), he regained 20 lb but lost another 10 lb in the last few weeks. (13% weight loss x 6 months). Usual weight 225 lb.  He has had taste changes, feeling weaker at home (usually stays in one spot all day  while wife works), and poor appetite for > 1 month. Breakfast: cereal, Lunch: sandwich or nothing, Dinner: less than 50% of his normal intake or none.    Diet Order:  Diet NPO time specified  Skin:  Wound (see comment) (bilateral foot wound)  Last BM:  unknown  Height:   Ht Readings from Last 1 Encounters:  12/29/14 '6\' 1"'$  (1.854 m)    Weight:   Wt Readings from Last 1 Encounters:  12/29/14 191 lb 5.8 oz (86.8 kg)    Ideal Body Weight:  83.6 kg  BMI:  Body mass index is 25.25 kg/(m^2).  Estimated Nutritional Needs:   Kcal:  2103  Protein:  130-160 grams  Fluid:  > 2.1 L/day  EDUCATION NEEDS:   No education needs identified at this time  [place], [place], [place] Pager [phone] After Hours Pager

## 2014-12-31 NOTE — Progress Notes (Signed)
PT Cancellation Note  Patient Details Name: Joseph Reed MRN: [id number] DOB: 10/31/1945   Cancelled Treatment:    Reason Eval/Treat Not Completed: Patient not medically ready.  Pt continues to be on bedrest.  Will need updated activity orders once appropriate for mobility and PT.     Toba Claudio, Thornton Papas 12/31/2014, 8:08 AM

## 2014-12-31 NOTE — Progress Notes (Signed)
OT Cancellation Note  Patient Details Name: Joseph Reed MRN: [id number] DOB: [birth year]   Cancelled Treatment:    Reason Eval/Treat Not Completed: Patient not medically ready Pt on bedrest and weaning today. Will assess when activity level updated. thanks [place], OTR/L  [phone] 12/31/2014 12/31/2014, 9:51 AM

## 2014-12-31 NOTE — Progress Notes (Signed)
Order to advance ETT 3cm, ETT 22 @ lip on arrival to room, advanced to 25 @ lip at this time, repeat x ray pending, RT will monitor

## 2014-12-31 NOTE — Progress Notes (Signed)
PULMONARY / CRITICAL CARE MEDICINE   Name: Joseph Reed MRN: [id number] DOB: 1945/08/19    ADMISSION DATE:  12/29/2014 CONSULTATION DATE:  12/29/14  REFERRING MD :  Estanislado Pandy  CHIEF COMPLAINT:  CVA  INITIAL PRESENTATION:  69 y.o. M admitted to [facility] 10/26 with generalized weakness.  10/26, developed right sided weakness and inability to follow commands.  Found to have acute CVA, transferred to [facility], intubated and taken for IR revascularization.  STUDIES:  CT cerebral perfusion 10/26 - completed infarcts involving the inferior division right MCA and left PCA territory at the occipital lobe.  Associated cytotoxic edema noted.  Extensive left cerebral ischemia in the setting of left ICA occlusion. CTA head / neck 10/26 - acute appearing occlusion of left ICA.  Right M1 - M2 branch occlusion with complete infarct.  High grade stenosis of P2 and P3 segments.  Diffuse moderate right ICA stenosis. Portable CXR 10/27 - Improving aeration left upper lobe compared with prior chest x-ray. TTE 10/27 - A. fib. Moderate LVH. EF 30%. Mildly dilated RV & mildly decreased RV systolic function. Multiple wall motion abnormalities. Portable CXR 10/28 - continued aeration LUL & silhouetted left hemidiaphragm. ETT 7cm above carina.  SIGNIFICANT EVENTS: 10/23 - admitted to [facility] with generalized weakness. 10/26 - transferred to [facility] for acute CVA.  Taken to IR for revascularization. 10/26 - chest PT & nebs started for secretions & mucus plugging 10/27 - cultured for fever 10/28 - antibiotics started for CAP/Aspiration Pneumonia  SUBJECTIVE: No acute events overnight. Continues to have some refills. Patient more awake this morning but still not consistently following commands.  REVIEW OF SYSTEMS:  Unable to obtain as patient is intubated & remains altered.  VITAL SIGNS: Temp:  [99.1 F (37.3 C)-101.1 F (38.4 C)] 99.3 F (37.4 C) (10/28 0900) Pulse Rate:  [62-126] 126 (10/28 0900) Resp:  [13-28]  15 (10/28 0900) BP: (106-162)/(41-87) 130/62 mmHg (10/28 0900) SpO2:  [99 %-100 %] 100 % (10/28 0900) Arterial Line BP: (100-169)/(45-65) 159/59 mmHg (10/28 0900) FiO2 (%):  [40 %] 40 % (10/28 0906) HEMODYNAMICS:   VENTILATOR SETTINGS: Vent Mode:  [-] PSV FiO2 (%):  [40 %] 40 % Set Rate:  [14 bmp] 14 bmp Vt Set:  [600 mL] 600 mL PEEP:  [5 cmH20] 5 cmH20 Pressure Support:  [12 cmH20] 12 cmH20 Plateau Pressure:  [14 cmH20-20 cmH20] 20 cmH20 INTAKE / OUTPUT: Intake/Output      10/27 0701 - 10/28 0700 10/28 0701 - 10/29 0700   I.V. (mL/kg) 2106.7 (24.3)    Total Intake(mL/kg) 2106.7 (24.3)    Urine (mL/kg/hr) 860 (0.4) 100 (0.3)   Total Output 860 100   Net +1246.7 -100          PHYSICAL EXAMINATION: General:  Sedated. Awake this morning. Off of sedation at the time of exam.  Integument:  Warm & dry. No rash on exposed skin.  HEENT:  No scleral injection. Endotracheal tube in place. PERRL. Cardiovascular:  Regular rate. No edema.   Pulmonary:  Coarse breath sounds bilaterally. Symmetric chest wall rise on ventilator. Continues to have some thick secretions. Abdomen: Soft. Normal bowel sounds. Nondistended.  Neurological: Patient initially gave a thumbs up on command but then followed no further commands. Spontaneously moving lower extremity. Eyes are more open today and he does seem to track.  LABS:  CBC  Recent Labs Lab 12/30/14 0456 12/30/14 1558 12/31/14 0510  WBC 14.6*  --  11.5*  HGB 9.7* 8.9* 8.4*  HCT 30.6* 27.8* 26.8*  PLT 312  --  273   Coag's No results for input(s): APTT, INR in the last 168 hours. BMET  Recent Labs Lab 12/29/14 1245 12/30/14 0456 12/31/14 0510  NA 136 135 137  K 3.9 4.1 3.6  CL 103 105 106  CO2  --  19* 23  BUN '19 15 14  '$ CREATININE 1.20 1.19 1.12  GLUCOSE 158* 153* 127*   Electrolytes  Recent Labs Lab 12/30/14 0456 12/31/14 0510  CALCIUM 8.4* 8.3*  MG  --  1.9  PHOS  --  2.7   Sepsis Markers  Recent Labs Lab  12/30/14 1115 12/31/14 0510  PROCALCITON <0.10 0.13   ABG  Recent Labs Lab 12/29/14 1608  PHART 7.426  PCO2ART 34.2*  PO2ART 450*   Liver Enzymes  Recent Labs Lab 12/31/14 0510  ALBUMIN 2.2*   Cardiac Enzymes  Recent Labs Lab 12/29/14 1709 12/29/14 2211 12/30/14 0456  TROPONINI 0.32* 0.29* 0.30*   Glucose  Recent Labs Lab 12/30/14 1257 12/30/14 1631 12/30/14 1959 12/31/14 0004 12/31/14 0406 12/31/14 0749  GLUCAP 161* 116* 128* 107* 115* 103*    Imaging Mr Brain Wo Contrast  12/30/2014  CLINICAL DATA:  69 year old male with recent right ACA infarct earlier this month. Anti-platelet therapy discontinued due to gastrointestinal bleeding. Acute onset right side weakness. Left ICA/ MCA occlusion. Initial encounter. EXAM: MRI HEAD WITHOUT CONTRAST MRA HEAD WITHOUT CONTRAST TECHNIQUE: Multiplanar, multiecho pulse sequences of the brain and surrounding structures were obtained without intravenous contrast. Angiographic images of the head were obtained using MRA technique without contrast. COMPARISON:  CTA head and neck 12/29/2014. Cerebral angiogram 12/29/2014. Post angiogram head CT 12/29/2014. [facility] Brain MRI 12/07/2014. FINDINGS: MRI HEAD FINDINGS Confluent restricted diffusion in the right MCA territory affecting a moderate to large volume of brain. The anterior division is relatively spared. Confluent restricted diffusion in the left PCA territory affecting a moderate volume of brain predominately in the left occipital lobe. The left thalamus also is affected. Associated cytotoxic edema. No midline shift or significant intracranial mass effect at this time. Trace petechial hemorrhage in the superior left occipital lobe (series 9, image 17. No malignant hemorrhagic transformation. Major intracranial vascular flow voids appear stable. Stable cerebral volume. Expected evolution of the small right ACA territory infarct now with some encephalomalacia. Scattered  chronic small lacunar infarct appearing signal changes in the bilateral cerebral white matter superimposed on the above acute findings. Small chronic lacunar infarcts in the right caudate and thalamus. Stable patchy T2 hyperintensity in the brainstem. Stable scattered small chronic lacunar infarcts in the cerebellum. Oral enteric tubing is looped in the nasopharynx, oropharynx (series 5, image 2). Associated fluid in the pharynx also. Paranasal sinuses and mastoids remain relatively clear. Stable orbit and scalp soft tissues. Stable visualized cervical spine. Visualized bone marrow signal is within normal limits. MRA HEAD FINDINGS Stable posterior circulation with dominant distal left vertebral artery an the right functionally terminating in PICA. No basilar artery stenosis. Left PCA origin is fetal type. Both the left posterior communicating artery and P1 segments remain patent. Occlusion of the superior division of the left PCA at the P 2 to P3 segment bifurcation. Mild to moderate irregularity in the patent inferior division. Fetal type right PCA origin. Mild to moderate irregularity of the right PCA branches with better preserved distal flow. Improved flow in the visible left ICA which now appears patent throughout the left ICA siphon, but with moderate to severe irregularity intermittently noted and maximal in the pre cavernous  segment. Stable stenosis of the distal cervical right ICA and moderate irregularity throughout the right ICA siphon. Both carotid termini now are patent. MCA and ACA origins are normal. Anterior communicating artery and visualized bilateral ACA branches are within normal limits. Left MCA M1 segment, bifurcation, and visualized left MCA branches are within normal limits. Right MCA M1 segment is patent. The posterior right MCA division is occluded just beyond its origin from the right MCA bifurcation. The anterior division branches appear within normal limits. IMPRESSION: 1. Oral enteric  tubing is looped in the pharynx. Recommend repositioning. 2. Moderate to large right MCA infarct, sparing the anterior division. Associated occlusion of the right MCA M2 posterior division. 3. Moderate to large left PCA infarct. Associated occlusion of the left PCA P3 superior division. 4. Improved patency of the left ICA siphon since the CTA yesterday. Moderate to severe bilateral ICA irregularity and stenosis. 5. Trace petechial hemorrhage in the left PCA territory. No significant intracranial mass effect at this time and no malignant hemorrhagic transformation. Electronically Signed   By: Genevie Ann M.D.   On: 12/30/2014 15:22   Dg Chest Port 1 View  12/31/2014  CLINICAL DATA:  Mucous plugging of the bronchi EXAM: PORTABLE CHEST 1 VIEW COMPARISON:  12/30/2014 FINDINGS: NG tube with tip in proximal stomach just below the GE junction. Endotracheal tube with tip 7.8 cm above the carina. No pulmonary edema. There is small left pleural effusion with left lower lobe atelectasis or infiltrate. IMPRESSION: NG tube with tip in proximal stomach just below the GE junction. Endotracheal tube with tip 7.8 cm above the carina. No pneumothorax. Small left pleural effusion with left basilar atelectasis or infiltrate. Electronically Signed   By: Lahoma Crocker M.D.   On: 12/31/2014 07:59   Dg Abd Portable 1v  12/30/2014  CLINICAL DATA:  Orogastric tube placement. EXAM: PORTABLE ABDOMEN - 1 VIEW COMPARISON:  None. FINDINGS: Orogastric tube has been placed. The tip extends below the diaphragm into the proximal stomach. There is no bowel dilation to suggest obstruction. IMPRESSION: Orogastric tube tip projects in the proximal stomach. Electronically Signed   By: Lajean Manes M.D.   On: 12/30/2014 16:39   Mr Jodene Nam Head/brain Wo Cm  12/30/2014  CLINICAL DATA:  69 year old male with recent right ACA infarct earlier this month. Anti-platelet therapy discontinued due to gastrointestinal bleeding. Acute onset right side weakness. Left  ICA/ MCA occlusion. Initial encounter. EXAM: MRI HEAD WITHOUT CONTRAST MRA HEAD WITHOUT CONTRAST TECHNIQUE: Multiplanar, multiecho pulse sequences of the brain and surrounding structures were obtained without intravenous contrast. Angiographic images of the head were obtained using MRA technique without contrast. COMPARISON:  CTA head and neck 12/29/2014. Cerebral angiogram 12/29/2014. Post angiogram head CT 12/29/2014. [facility] Brain MRI 12/07/2014. FINDINGS: MRI HEAD FINDINGS Confluent restricted diffusion in the right MCA territory affecting a moderate to large volume of brain. The anterior division is relatively spared. Confluent restricted diffusion in the left PCA territory affecting a moderate volume of brain predominately in the left occipital lobe. The left thalamus also is affected. Associated cytotoxic edema. No midline shift or significant intracranial mass effect at this time. Trace petechial hemorrhage in the superior left occipital lobe (series 9, image 17. No malignant hemorrhagic transformation. Major intracranial vascular flow voids appear stable. Stable cerebral volume. Expected evolution of the small right ACA territory infarct now with some encephalomalacia. Scattered chronic small lacunar infarct appearing signal changes in the bilateral cerebral white matter superimposed on the above acute findings. Small chronic  lacunar infarcts in the right caudate and thalamus. Stable patchy T2 hyperintensity in the brainstem. Stable scattered small chronic lacunar infarcts in the cerebellum. Oral enteric tubing is looped in the nasopharynx, oropharynx (series 5, image 2). Associated fluid in the pharynx also. Paranasal sinuses and mastoids remain relatively clear. Stable orbit and scalp soft tissues. Stable visualized cervical spine. Visualized bone marrow signal is within normal limits. MRA HEAD FINDINGS Stable posterior circulation with dominant distal left vertebral artery an the right  functionally terminating in PICA. No basilar artery stenosis. Left PCA origin is fetal type. Both the left posterior communicating artery and P1 segments remain patent. Occlusion of the superior division of the left PCA at the P 2 to P3 segment bifurcation. Mild to moderate irregularity in the patent inferior division. Fetal type right PCA origin. Mild to moderate irregularity of the right PCA branches with better preserved distal flow. Improved flow in the visible left ICA which now appears patent throughout the left ICA siphon, but with moderate to severe irregularity intermittently noted and maximal in the pre cavernous segment. Stable stenosis of the distal cervical right ICA and moderate irregularity throughout the right ICA siphon. Both carotid termini now are patent. MCA and ACA origins are normal. Anterior communicating artery and visualized bilateral ACA branches are within normal limits. Left MCA M1 segment, bifurcation, and visualized left MCA branches are within normal limits. Right MCA M1 segment is patent. The posterior right MCA division is occluded just beyond its origin from the right MCA bifurcation. The anterior division branches appear within normal limits. IMPRESSION: 1. Oral enteric tubing is looped in the pharynx. Recommend repositioning. 2. Moderate to large right MCA infarct, sparing the anterior division. Associated occlusion of the right MCA M2 posterior division. 3. Moderate to large left PCA infarct. Associated occlusion of the left PCA P3 superior division. 4. Improved patency of the left ICA siphon since the CTA yesterday. Moderate to severe bilateral ICA irregularity and stenosis. 5. Trace petechial hemorrhage in the left PCA territory. No significant intracranial mass effect at this time and no malignant hemorrhagic transformation. Electronically Signed   By: Genevie Ann M.D.   On: 12/30/2014 15:22    ASSESSMENT / PLAN:  NEUROLOGIC A:   Acute metabolic encephalopathy - due to  sedation and acute CVA. New infarcts involving the inferior division right MCA and left PCA / ICA territory at the occipital lobe with associated cytotoxic edema - s/p neuro IR revascularization 10/26. H/O anxiety  P:   Further workup & MRI per Neurology Sedation:  Propofol gtt & Fentanyl PRN. ASA '325mg'$  daily Lipitor '80mg'$  qhs RASS goal: 0  S/P Integrilin Daily Sedation Vacation  PULMONARY OETT 10/26 >>> A: Acute Respiratory Failure - due to inability to protect airway in the setting of acute CVA. 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 H/O COPD by report - no PFT's in system ETT 7cm above carina  P:   Advance ETT 3cm & repeat CXR to confirm placement SBT in AM 10/29 Repeat Portable CXR in AM. Continuing Chest PT Continuing Mucomyst & Albuterol nebs q6hr for airway clearance  CARDIOVASCULAR A:  Troponin I Elevation - likely demand ischemia due to GI bleeding. Multiple wall motion abnormalities on TTE. Biventricular dysfunction. Tight BP control - per neuro IR, goal BP 120 - 140. H/O HTN & CHF (Echo from  10/23 at [place] with EF 30-35%) H/O Atrial Fibrillation H/O CAD & MI s/p PCI (on plavix and xarelto)  P:  BP parameters per Neuro & IR Continuing to hold Xarleto ASA '325mg'$  daily Lipitor '80mg'$  qhs Holding Coreg for now Labetalol IV prn Monitoring patient on Telemetry  RENAL A:   Hypocalcemia - Mild. S/P Calcium gluconate.  P:   NS @ 75cc/hr Monitoring UOP with foley catheter Monitoring renal function with daily BUN/Creatinine  GASTROINTESTINAL A:   Acute blood loss anemia due to GI bleed - s/p 4 PRBC transfusions at [facility]. H/O GERD  P:   Monitor for further signs of GI bleeding. Follow Hgb & transfusion guidelines as outlined below. Protonix to IV q12hr Holding on GI consult pending improvement in  mental status NPO for now but consider tube feeds if unable to extubate 10/29  HEMATOLOGIC A:   Anemia - Presumed lower GI bleeding - s/p 4 PRBC transfusions at [facility]. No active bleeding seen. Leukocytosis - Improving. Reactive versus Sepsis given recent fever & intervention VTE Prophylaxis  P:  Trending Hgb q12hr Transfuse for Hgb < 7. SCD's. Lovenox [place] q24hr  INFECTIOUS A:   CAP/Aspiration Pneumonia  P:   Starting Rocephin 2gm IV q24hr Procalcitonin algorithm Trending leukocytosis  Respiratory Ctx 10/27>>>GPC on stain Blood Ctx 10/27>>> Urine Ctx 10/27 - negative   ENDOCRINE A:   Hyperglycemia   P:   Accuchecks q4hr  Low dose SSI coverage   Family updated: No family at bedside to update this morning.  Interdisciplinary Family Meeting v Palliative Care Meeting:  Due by: 11/1.  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 Patient underwent embolectomy on 10/26. Patient's mental status has improved somewhat this morning as he is more awake with eyes open but still not consistently following commands. Given the gram-positive cocci seen on the stain from his respiratory culture I am starting the patient on Rocephin for community acquired pneumonia/aspiration pneumonia. Leukocytosis is trending downward which is reassuring. Continuing airway clearance maneuvers. If patient is unable to extubate tomorrow morning we will consider initiating tube feedings at that time.   I have spent a total of 36 minutes of critical care time this morning caring for this patient and reviewing his electronic medical record.  Sonia Baller Ashok Cordia, M.D. [facility] Pulmonary & Critical Care Pager:  [phone] After 3pm or if no response, call [phone]  12/31/2014, 10:42 AM

## 2014-12-31 NOTE — Progress Notes (Signed)
Pt placed on PS trial at this time, no plan to extubate today per Dr. Ashok Cordia, Will wean on PS throughout day as tolerated, RT will monitor.

## 2014-12-31 NOTE — Progress Notes (Signed)
STROKE TEAM PROGRESS NOTE   SUBJECTIVE (INTERVAL HISTORY) No family at bedside, still intubated on vent. Seems more awake but not following commands. Hb slowly trending down. On ASA.   OBJECTIVE Temp:  [99.1 F (37.3 C)-101.1 F (38.4 C)] 99.3 F (37.4 C) (10/28 0900) Pulse Rate:  [62-126] 126 (10/28 0900) Cardiac Rhythm:  [-] Atrial fibrillation (10/28 0400) Resp:  [13-28] 15 (10/28 0900) BP: (106-162)/(41-87) 130/62 mmHg (10/28 0900) SpO2:  [99 %-100 %] 100 % (10/28 0900) Arterial Line BP: (100-169)/(45-65) 159/59 mmHg (10/28 0900) FiO2 (%):  [40 %] 40 % (10/28 0906)  CBC:   Recent Labs Lab 12/30/14 0456 12/30/14 1558 12/31/14 0510  WBC 14.6*  --  11.5*  NEUTROABS  --   --  8.8*  HGB 9.7* 8.9* 8.4*  HCT 30.6* 27.8* 26.8*  MCV 90.0  --  89.6  PLT 312  --  627    Basic Metabolic Panel:   Recent Labs Lab 12/30/14 0456 12/31/14 0510  NA 135 137  K 4.1 3.6  CL 105 106  CO2 19* 23  GLUCOSE 153* 127*  BUN 15 14  CREATININE 1.19 1.12  CALCIUM 8.4* 8.3*  MG  --  1.9  PHOS  --  2.7    Lipid Panel:     Component Value Date/Time   CHOL 143 12/30/2014 0453   TRIG 91 12/31/2014 0510   HDL 23* 12/30/2014 0453   CHOLHDL 6.2 12/30/2014 0453   VLDL 23 12/30/2014 0453   LDLCALC 97 12/30/2014 0453   HgbA1c:  Lab Results  Component Value Date   HGBA1C 5.8* 12/30/2014   Urine Drug Screen: No results found for: LABOPIA, COCAINSCRNUR, LABBENZ, AMPHETMU, THCU, LABBARB    IMAGING I have personally reviewed the radiological images below and agree with the radiology interpretations.  Ct Head Wo Contrast 12/29/2014   No evidence for complication related endovascular treatment, such as subarachnoid or parenchymal hemorrhage. No evidence for cortical ischemia within the LEFT MCA or LEFT ACA territory. Acute RIGHT MCA and LEFT PCA infarcts appear unchanged as previously outlined on earlier perfusion scan.   Ct Angio Head & Neck W/cm &/or Wo/cm 12/29/2014   1. Acute  appearing occlusion of the left ICA from the cervical portion to the ophthalmic segment. 2. Right M1 -M2 branch occlusion of the inferior division with completed infarct. 3. High-grade stenosis of the P2 and P3 segments with completed medial occipital lobe infarct. 4. Diffuse moderate right ICA stenosis with appearance of dissection, favored chronic given low-density mural appearance. 5. High-grade stenosis of the bilateral V1 segments. 6. Extensive intracranial atherosclerosis with multi focal moderate stenosis.   Ct Cerebral Perfusion W/cm 12/29/2014  1. Completed infarcts involving the inferior division right MCA territory and in the left PCA territory at the occipital lobe. Associated cytotoxic edema is newly seen since yesterday's CT. 2. Extensive left cerebral ischemia/penumbra in the setting of left ICA occlusion.   Dg Chest Port 1 View 12/30/2014  Improved aeration of the left lung. Persistent dense consolidation or atelectasis in the left lower lobe and likely small left pleural effusion.  12/29/2014   1. New significant opacity in the left lung consisting of left pleural effusion and atelectasis or infiltrate. 2. Postoperative changes in the left lung. 3. Placement of endotracheal tube and nasogastric tube.  12/31/2014  IMPRESSION: NG tube with tip in proximal stomach just below the GE junction. Endotracheal tube with tip 7.8 cm above the carina. No pneumothorax. Small left pleural effusion with left basilar  atelectasis or infiltrate.  R & L common carotidarteriogram LT ICA and LT MCA occlusion with recannulization TICI 3  2D echo - - The patient was in atrial fibrillation. Moderate LV hypertrophy. EF 30% with wall motion abnormalities as noted above. Aortic sclerosis without significant stenosis. Mildly dilated RV with mildly decreased systolic function. Moderate biatrial enlargement.  MRI and MRA brain 12/30/2014  IMPRESSION: 1. Oral enteric tubing is looped in the pharynx.  Recommend repositioning. 2. Moderate to large right MCA infarct, sparing the anterior division. Associated occlusion of the right MCA M2 posterior division. 3. Moderate to large left PCA infarct. Associated occlusion of the left PCA P3 superior division. 4. Improved patency of the left ICA siphon since the CTA yesterday. Moderate to severe bilateral ICA irregularity and stenosis. 5. Trace petechial hemorrhage in the left PCA territory. No significant intracranial mass effect at this time and no malignant hemorrhagic transformation.     PHYSICAL EXAM  Temp:  [99.1 F (37.3 C)-101.1 F (38.4 C)] 99.3 F (37.4 C) (10/28 0900) Pulse Rate:  [62-126] 126 (10/28 0900) Resp:  [13-28] 15 (10/28 0900) BP: (106-162)/(41-87) 130/62 mmHg (10/28 0900) SpO2:  [99 %-100 %] 100 % (10/28 0900) Arterial Line BP: (100-169)/(45-65) 159/59 mmHg (10/28 0900) FiO2 (%):  [40 %] 40 % (10/28 0906)  General - Well nourished, well developed, intubated, on propofol.  Ophthalmologic - Fundi not visualized due to small pupils.  Cardiovascular - irregularly irregular heart rate and rhythm, afib with RVR.  Neuro - intubated, on propofol, eyes closed, not following commands. PERRL, positive corneal and gag, not blink to visual threat with forced eye opening. On pain stimulation, pt spontaneously moving right UE and LE, against gravity, but trace withdraw LUE and 2/5 LLE. Babinski negative, DTR 1+.    ASSESSMENT/PLAN Mr. Joseph Reed is a 69 y.o. male with history of MI s/p stenting in 06/2014, HTN, smoker, lung  Cancer, recent stroke found to have afib on Xarelto and plavix but pt stopped Xarelto two weeks ago, recent LGIB admitted to [facility] for blood transfusion, transferred to [facility] from [place] for aphasia and right LE weakness.  S/p complete revascularization of LT ICA occlusion.    Stroke:  R MCA, L PCA infarcts, showing on CTP. S/P L ICA TICI3 revascularization. Stroke etiology likely due to afib not  on anticoagulation due to GIB. However, pt does have significant large vessel athero, which could be etiology for stroke too. However, his initial right side weakness with aphasia did not fit to the stroke identified by CT, needs MRI for further evaluation.  Resultant  Intubated with left sided weakness  MRI confirmed left PCA and right MCA infarcts  MRA left ICA remains open but b/l ICA athero, right M2 and left P2 occlusion  CTA head and neck and CTP - left ICA occlusion, right MCA infarct and left PCA infarct but left MCA and ACA are open  Angio - left ICA occlusion with TICI3 recannulization  2D Echo EF 30%   LDL 97  HgbA1c 5.8  lovenox for VTE prophylaxis Diet NPO time specified  clopidogrel 75 mg daily and Xarelto (rivaroxaban) daily prior to admission for atrial fibrillation, now on ASA '325mg'$ .  Ongoing aggressive stroke risk factor management  Therapy recommendations:  pending   Disposition:  pending   History of stroke  06/2014 - after cardiac cath, MRI showed two punctate infarct - brainstem and right parietal WM  12/07/14 - small right ACA and punctate right peri-ventricle   Intracranial  atherosclerosis  Left ICA occlusion s/p recannulization  Right ICA diffuse stenosis  Right M2 occlusion  Left P2 occlusion  Bilateral VA stenosis  Diffuse intracranial athero with multi focal moderate stenosis  Atrial Fibrillation  Home anticoagulation:  xarelto and plavix  Pt stopped Xarelto by himself prior to GIB  Hold off AC due to large stroke and recent GIB requiring blood transfusion.   HR 110s  Monitoring HR, may consider Cardizem if RVR  Lung cancer  Squamous cell lung cancer s/p LLL lobectomy 09/2013 - pt was supposed to have post op chemo / XRT but he apparently declined.  With adjacent lymph node involvement  Staging not done yet  Left lung atelectasis  Overnight CXR comfirmed  On chest PT, suctioning, albuterol and mucomyst  Repeat CXR  improved aeration   CCM on board  LGIB  Hgb 5.8 at [place], S/p 4u PRBCs at [facility]  Suspected variceal bleeding  Taking motrin bid for arthritis pain prior to admission to [facility] 9.7-> 8.4  currently on ASA for stroke   Hb and GIB Q12   Respiratory Failure  Intubated for neuro intervention  CXR with near complete opacity with Mucous plug  Can not extubate today  CAD with MI s/p stent  On plavix prior to [place]  Had MI in 06/2014 s/p PCI and stent  Tobacco abuse  Current smoker  Hypertension  Stable on the low side Permissive hypertension (OK if < 220/120) but gradually normalize in 5-7 days  Hyperlipidemia  Home meds:  unclear  LDL 97, goal < 70  Put on lipitor 80  continue statin at discharge  Other Stroke Risk Factors  Advanced age  hx ETOH abuse  Hx CHF  Other Active Problems  Hx COPD, emphysema  Troponin elevation, felt to be demand ischemia due to stroke  Hypocalcemia  Febrile, TMax 101.3  Hospital day # 2  This patient is critically ill due to embolic stroke, left ICA occlusion, right ICA stenosis, afib with RVR, GIB requiring blood transfusion, lung cancer, lung atelectasis, respiratory failure and at significant risk of neurological worsening, death form hemorrhagic transformation, recurrent infarct, heart failure, brain herniation, shock and sepsis. This patient's care requires constant monitoring of vital signs, hemodynamics, respiratory and cardiac monitoring, review of multiple databases, neurological assessment, discussion with family, other specialists and medical decision making of high complexity. I spent 40 minutes of neurocritical care time in the care of this patient.   Rosalin Hawking, MD PhD Stroke Neurology 12/31/2014 10:21 AM      To contact Stroke Continuity provider, please refer to [email or web address] After hours, contact General Neurology

## 2015-01-01 ENCOUNTER — Inpatient Hospital Stay (HOSPITAL_COMMUNITY): Payer: Medicare Other

## 2015-01-01 DIAGNOSIS — J189 Pneumonia, unspecified organism: Secondary | ICD-10-CM | POA: Insufficient documentation

## 2015-01-01 DIAGNOSIS — I63032 Cerebral infarction due to thrombosis of left carotid artery: Secondary | ICD-10-CM | POA: Diagnosis not present

## 2015-01-01 LAB — CBC WITH DIFFERENTIAL/PLATELET
BASOS ABS: 0 10*3/uL (ref 0.0–0.1)
BASOS PCT: 0 %
EOS ABS: 0.2 10*3/uL (ref 0.0–0.7)
EOS PCT: 2 %
HEMATOCRIT: 26 % — AB (ref 39.0–52.0)
Hemoglobin: 8.3 g/dL — ABNORMAL LOW (ref 13.0–17.0)
Lymphocytes Relative: 16 %
Lymphs Abs: 1.6 10*3/uL (ref 0.7–4.0)
MCH: 29 pg (ref 26.0–34.0)
MCHC: 31.9 g/dL (ref 30.0–36.0)
MCV: 90.9 fL (ref 78.0–100.0)
MONO ABS: 1.1 10*3/uL — AB (ref 0.1–1.0)
MONOS PCT: 11 %
Neutro Abs: 7.4 10*3/uL (ref 1.7–7.7)
Neutrophils Relative %: 71 %
Platelets: 267 10*3/uL (ref 150–400)
RBC: 2.86 MIL/uL — ABNORMAL LOW (ref 4.22–5.81)
RDW: 14.9 % (ref 11.5–15.5)
WBC: 10.3 10*3/uL (ref 4.0–10.5)

## 2015-01-01 LAB — RENAL FUNCTION PANEL
Albumin: 2.2 g/dL — ABNORMAL LOW (ref 3.5–5.0)
Anion gap: 7 (ref 5–15)
BUN: 12 mg/dL (ref 6–20)
CHLORIDE: 106 mmol/L (ref 101–111)
CO2: 21 mmol/L — ABNORMAL LOW (ref 22–32)
CREATININE: 1.02 mg/dL (ref 0.61–1.24)
Calcium: 7.8 mg/dL — ABNORMAL LOW (ref 8.9–10.3)
Glucose, Bld: 105 mg/dL — ABNORMAL HIGH (ref 65–99)
Phosphorus: 2.8 mg/dL (ref 2.5–4.6)
Potassium: 3.4 mmol/L — ABNORMAL LOW (ref 3.5–5.1)
Sodium: 134 mmol/L — ABNORMAL LOW (ref 135–145)

## 2015-01-01 LAB — PROCALCITONIN: Procalcitonin: 0.1 ng/mL

## 2015-01-01 LAB — GLUCOSE, CAPILLARY
GLUCOSE-CAPILLARY: 88 mg/dL (ref 65–99)
GLUCOSE-CAPILLARY: 113 mg/dL — AB (ref 65–99)
Glucose-Capillary: 92 mg/dL (ref 65–99)
Glucose-Capillary: 147 mg/dL — ABNORMAL HIGH (ref 65–99)
Glucose-Capillary: 131 mg/dL — ABNORMAL HIGH (ref 65–99)

## 2015-01-01 LAB — HEMOGLOBIN AND HEMATOCRIT, BLOOD
HCT: 26 % — ABNORMAL LOW (ref 39.0–52.0)
HEMOGLOBIN: 8 g/dL — AB (ref 13.0–17.0)

## 2015-01-01 LAB — MAGNESIUM: Magnesium: 1.9 mg/dL (ref 1.7–2.4)

## 2015-01-01 LAB — TRIGLYCERIDES: TRIGLYCERIDES: 94 mg/dL (ref <150)

## 2015-01-01 LAB — CULTURE, RESPIRATORY W GRAM STAIN

## 2015-01-01 LAB — CULTURE, RESPIRATORY: SPECIAL REQUESTS: NORMAL

## 2015-01-01 MED ORDER — POTASSIUM CHLORIDE 10 MEQ/100ML IV SOLN
10.0000 meq | INTRAVENOUS | Status: DC
  Administered 2015-01-01: 10 meq via INTRAVENOUS
  Filled 2015-01-01 (×4): qty 100

## 2015-01-01 MED ORDER — MIDAZOLAM HCL 2 MG/2ML IJ SOLN
1.0000 mg | INTRAMUSCULAR | Status: DC | PRN
  Administered 2015-01-01 – 2015-01-02 (×3): 1 mg via INTRAVENOUS
  Filled 2015-01-01 (×2): qty 2

## 2015-01-01 MED ORDER — VITAL AF 1.2 CAL PO LIQD
1000.0000 mL | ORAL | Status: DC
  Administered 2015-01-01: 1000 mL
  Filled 2015-01-01 (×8): qty 1000

## 2015-01-01 MED ORDER — VITAL HIGH PROTEIN PO LIQD: 1000.0000 mL | ORAL | Status: DC

## 2015-01-01 MED ORDER — POTASSIUM CHLORIDE 10 MEQ/100ML IV SOLN
10.0000 meq | Freq: Once | INTRAVENOUS | Status: AC
  Administered 2015-01-01: 10 meq via INTRAVENOUS

## 2015-01-01 MED ORDER — CALCIUM CARBONATE 1250 (500 CA) MG PO TABS
500.0000 mg | ORAL_TABLET | Freq: Two times a day (BID) | ORAL | Status: DC
  Administered 2015-01-01 – 2015-01-02 (×3): 500 mg via ORAL
  Filled 2015-01-01 (×5): qty 1

## 2015-01-01 MED ORDER — MIDAZOLAM HCL 2 MG/2ML IJ SOLN
1.0000 mg | INTRAMUSCULAR | Status: DC | PRN
  Administered 2015-01-01: 1 mg via INTRAVENOUS
  Filled 2015-01-01 (×2): qty 2

## 2015-01-01 NOTE — Progress Notes (Signed)
PT Cancellation Note  Patient Details Name: Joseph Reed MRN: [id number] DOB: 05/19/1945   Cancelled Treatment:    Reason Eval/Treat Not Completed: Patient not medically ready. Pt continues to be on bedrest. Will need updated activity orders once appropriate for mobility and PT.  Joslyn Hy PT, DPT [phone] Pager: [phone] 01/01/2015, 8:43 AM

## 2015-01-01 NOTE — Progress Notes (Signed)
Noticed patient's urine has bright red blood present. Contacted CCM and received orders to discontinue Lovenox and make sure a CBC is ordered for 01/02/15 in the morning. Will continue to monitor patient and update as needed.

## 2015-01-01 NOTE — Progress Notes (Addendum)
STROKE TEAM PROGRESS NOTE  HISTORY Joseph Reed is a 69 y.o. male with multiple medical problems and a recent stroke 3 weeks ago. Patient was on anticoagulation and was admitted to [facility] with a GI bleed. Patient had to have all anticoagulation and antiplatelet therapy discontinued. On the day prior to admission he was noted to have some right sided weakness. The morning of admission he was found flaccid on the right and unable to communicate. Patient transferred here for possible interventional procedure.   Date last known well: Date: 12/28/2014 Time last known well: Time: 22:00 tPA Given: No: Outside time window, recent infarct, GI bleed   SUBJECTIVE (INTERVAL HISTORY) Overnight no issues per RN  OBJECTIVE Temp:  [99.1 F (37.3 C)-100.9 F (38.3 C)] 100.4 F (38 C) (10/29 0815) Pulse Rate:  [25-115] 99 (10/29 0815) Cardiac Rhythm:  [-] Atrial fibrillation (10/29 0800) Resp:  [11-21] 14 (10/29 0815) BP: (110-155)/(47-82) 131/75 mmHg (10/29 0800) SpO2:  [89 %-100 %] 100 % (10/29 0815) Arterial Line BP: (96-188)/(47-97) 140/58 mmHg (10/29 0815) FiO2 (%):  [40 %] 40 % (10/29 0723)  CBC:   Recent Labs Lab 12/31/14 0510 12/31/14 1501 01/01/15 0630  WBC 11.5*  --  10.3  NEUTROABS 8.8*  --  7.4  HGB 8.4* 8.2* 8.3*  HCT 26.8* 26.8* 26.0*  MCV 89.6  --  90.9  PLT 273  --  295    Basic Metabolic Panel:   Recent Labs Lab 12/31/14 0510 01/01/15 0630  NA 137 134*  K 3.6 3.4*  CL 106 106  CO2 23 21*  GLUCOSE 127* 105*  BUN 14 12  CREATININE 1.12 1.02  CALCIUM 8.3* 7.8*  MG 1.9 1.9  PHOS 2.7 2.8    Lipid Panel:     Component Value Date/Time   CHOL 143 12/30/2014 0453   TRIG 94 01/01/2015 0630   HDL 23* 12/30/2014 0453   CHOLHDL 6.2 12/30/2014 0453   VLDL 23 12/30/2014 0453   LDLCALC 97 12/30/2014 0453   HgbA1c:  Lab Results  Component Value Date   HGBA1C 5.8* 12/30/2014   Urine Drug Screen: No results found for: LABOPIA, COCAINSCRNUR, LABBENZ,  AMPHETMU, THCU, LABBARB    IMAGING I have personally reviewed the radiological images below and agree with the radiology interpretations.  Ct Head Wo Contrast 12/29/2014   No evidence for complication related endovascular treatment, such as subarachnoid or parenchymal hemorrhage. No evidence for cortical ischemia within the LEFT MCA or LEFT ACA territory. Acute RIGHT MCA and LEFT PCA infarcts appear unchanged as previously outlined on earlier perfusion scan.   Ct Angio Head & Neck W/cm &/or Wo/cm 12/29/2014   1. Acute appearing occlusion of the left ICA from the cervical portion to the ophthalmic segment. 2. Right M1 -M2 branch occlusion of the inferior division with completed infarct. 3. High-grade stenosis of the P2 and P3 segments with completed medial occipital lobe infarct. 4. Diffuse moderate right ICA stenosis with appearance of dissection, favored chronic given low-density mural appearance. 5. High-grade stenosis of the bilateral V1 segments. 6. Extensive intracranial atherosclerosis with multi focal moderate stenosis.   Ct Cerebral Perfusion W/cm 12/29/2014  1. Completed infarcts involving the inferior division right MCA territory and in the left PCA territory at the occipital lobe. Associated cytotoxic edema is newly seen since yesterday's CT. 2. Extensive left cerebral ischemia/penumbra in the setting of left ICA occlusion.   Dg Chest Port 1 View 12/30/2014  Improved aeration of the left lung. Persistent dense consolidation or atelectasis  in the left lower lobe and likely small left pleural effusion.  12/29/2014   1. New significant opacity in the left lung consisting of left pleural effusion and atelectasis or infiltrate. 2. Postoperative changes in the left lung. 3. Placement of endotracheal tube and nasogastric tube.  12/31/2014  IMPRESSION: NG tube with tip in proximal stomach just below the GE junction. Endotracheal tube with tip 7.8 cm above the carina. No pneumothorax. Small left  pleural effusion with left basilar atelectasis or infiltrate.  R & L common carotidarteriogram LT ICA and LT MCA occlusion with recannulization TICI 3  2D echo - - The patient was in atrial fibrillation. Moderate LV hypertrophy. EF 30% with wall motion abnormalities as noted above. Aortic sclerosis without significant stenosis. Mildly dilated RV with mildly decreased systolic function. Moderate biatrial enlargement.  MRI and MRA brain 12/30/2014  IMPRESSION: 1. Oral enteric tubing is looped in the pharynx. Recommend repositioning. 2. Moderate to large right MCA infarct, sparing the anterior division. Associated occlusion of the right MCA M2 posterior division. 3. Moderate to large left PCA infarct. Associated occlusion of the left PCA P3 superior division. 4. Improved patency of the left ICA siphon since the CTA yesterday. Moderate to severe bilateral ICA irregularity and stenosis. 5. Trace petechial hemorrhage in the left PCA territory. No significant intracranial mass effect at this time and no malignant hemorrhagic transformation.     PHYSICAL EXAM  Temp:  [99.1 F (37.3 C)-100.9 F (38.3 C)] 100.4 F (38 C) (10/29 0815) Pulse Rate:  [25-115] 99 (10/29 0815) Resp:  [11-21] 14 (10/29 0815) BP: (110-155)/(47-82) 131/75 mmHg (10/29 0800) SpO2:  [89 %-100 %] 100 % (10/29 0815) Arterial Line BP: (96-188)/(47-97) 140/58 mmHg (10/29 0815) FiO2 (%):  [40 %] 40 % (10/29 0723)  General - Well nourished, well developed, intubated, on propofol. HEENT-  NCAT, pupils reactive, sclera clear, intubated Cardiovascular - irregularly irregular heart rate and rhythm, afib with RVR. Pulmonary:  CTA GI- ND, normal bowel sounds GU- defered Extrem- No C/C/E  NEURO EXAM MENTAL STATUS: intubated, on propofol, eyes closed, not following commands.  CRANIAL NERVES: PERRL; does not blink to visual threat  Right gaze preference but will cross midline with OCRs Positive corneal on the right;  sluggish on the left Positive gag MOTOR/SENSORY EXAM: On pain stimulation, pt spontaneously moving right UE and LE, against gravity, but trace withdraw LUE and 2/5 LLE.  REFLEXES:  Babinski negative, DTR 1+.    ASSESSMENT/PLAN Mr. Joseph Reed is a 69 y.o. male with history of MI s/p stenting in 06/2014, HTN, smoker, lung  Cancer, recent stroke found to have afib on Xarelto and plavix but pt stopped Xarelto two weeks ago, recent LGIB admitted to [facility] for blood transfusion, transferred to [facility] from Home for aphasia and right LE weakness.  S/p complete revascularization of LT ICA occlusion.    Stroke:  R MCA, L PCA infarcts, showing on CTP. S/P L ICA TICI3 revascularization. Stroke etiology likely due to afib not on anticoagulation due to GIB. However, pt does have significant large vessel athero, which could be etiology for stroke too. However, his initial right side weakness with aphasia did not fit to the stroke identified by CT, needs MRI for further evaluation.  Resultant  Intubated with left sided weakness  MRI confirmed left PCA and right MCA infarcts  MRA left ICA remains open but b/l ICA athero, right M2 and left P2 occlusion  CTA head and neck and CTP - left ICA occlusion,  right MCA infarct and left PCA infarct but left MCA and ACA are open  Angio - left ICA occlusion with TICI3 recannulization  2D Echo EF 30%   LDL 97  HgbA1c 5.8  lovenox for VTE prophylaxis Diet NPO time specified  clopidogrel 75 mg daily and Xarelto (rivaroxaban) daily prior to admission for atrial fibrillation, now on ASA '325mg'$ .  Ongoing aggressive stroke risk factor management  Therapy recommendations:  pending   Disposition:  pending   History of stroke  06/2014 - after cardiac cath, MRI showed two punctate infarct - brainstem and right parietal WM  12/07/14 - small right ACA and punctate right peri-ventricle   Intracranial atherosclerosis  Left ICA occlusion s/p  recannulization  Right ICA diffuse stenosis  Right M2 occlusion  Left P2 occlusion  Bilateral VA stenosis  Diffuse intracranial athero with multi focal moderate stenosis  Atrial Fibrillation  Home anticoagulation:  xarelto and plavix  Pt stopped Xarelto by himself prior to GIB  Hold off AC due to large stroke and recent GIB requiring blood transfusion.   HR 110s  Monitoring HR, may consider Cardizem if RVR  Lung cancer  Squamous cell lung cancer s/p LLL lobectomy 09/2013 - pt was supposed to have post op chemo / XRT but he apparently declined.  With adjacent lymph node involvement  Staging not done yet  Left lung atelectasis  Overnight CXR comfirmed  On chest PT, suctioning, albuterol and mucomyst  Repeat CXR improved aeration   CCM on board  LGIB  Hgb 5.8 at [place], S/p 4u PRBCs at [facility]  Suspected variceal bleeding  Taking motrin bid for arthritis pain prior to admission to [facility] 9.7-> 8.4  currently on ASA for stroke   Hb and GIB Q12   Respiratory Failure  Intubated for neuro intervention  CXR with near complete opacity with Mucous plug  Can not extubate today  CAD with MI s/p stent  On plavix prior to [place]  Had MI in 06/2014 s/p PCI and stent  Tobacco abuse  Current smoker  Hypertension  Stable on the low side Permissive hypertension (OK if < 220/120) but gradually normalize in 5-7 days  Hyperlipidemia  Home meds:  unclear  LDL 97, goal < 70  Put on lipitor 80  continue statin at discharge  Other Stroke Risk Factors  Advanced age  hx ETOH abuse  Hx CHF  Other Active Problems  Hx COPD, emphysema  Troponin elevation, felt to be demand ischemia due to stroke  Hypocalcemia  Febrile, TMax 101.3   Attending System Review AND PLAN FOR 01/01/2015  Neuro  S/p complete revascularization of LT ICA occlusion.   Continue neuro exams per protocol  Cardiac  Atrial fibrillation - Xarelto and Plavix  recently discontinued secondary to GI bleed.  History of coronary artery disease status post stenting.  EF 30%. CHF history.  Per vital recordings, HR range labile with significant bradycardia in the evening yesterday; however, with further review the bradycardia is demonstrated on the pulse ox and not on EKG; will continue continuous cardiopulmonary monitoring   Set SBP goal for <160  Pulmonary  History of lung cancer and ongoing tobacco use  Portable chest x-ray 12/29/2014 - 1. New significant opacity in the left lung consisting of left pleural effusion and atelectasis or infiltrate. 2. Postoperative changes in the left lung. 3. Placement of endotracheal tube and nasogastric tube.  CXR in the AM  GI  History of recent GI bleed - anticoagulation discontinued.  GU  I / O - 2098 / 008 = 1213  Endo  No history of diabetes mellitus - hemoglobin A1c 5.8  Heme / ID  Anemia H/H 8.3 / 26.0 - stable  Electrolytes  Potassium 3.4; will replete  Sodium 134; hyponatremia; will monitor  Ionized calcium low; will replete  Phos:  2.8; will continue to monitor  Hospital day # 3  CRITICAL CARE NEUROLOGY ATTENDING NOTE Patient was seen and examined by me personally. I reviewed notes, independently viewed imaging studies, participated in medical decision making and plan of care. I have made additions or clarifications directly to the above note.  Documentation accurately reflects findings. The laboratory and radiographic studies were personally reviewed by me.  ROS could not be fully documented due to LOC  Assessment and plan completed by me personally and fully documented above.  Condition is unchanged   This patient is critically ill and at significant risk of neurological worsening, death and care requires constant monitoring of vital signs, hemodynamics,respiratory and cardiac monitoring, extensive review of multiple databases, frequent neurological assessment, discussion  with family, other specialists and medical decision making of high complexity.  This critical care time does not reflect procedure time, or teaching time or supervisory time of PA/NP/Med Resident etc. but could involve care discussion time.  I spent 50 minutes of Neurocritical Care time in the care of  this patient.  SIGNED BY: Dr. Elissa Hefty        To contact Stroke Continuity provider, please refer to [email or web address] After hours, contact General Neurology

## 2015-01-01 NOTE — Progress Notes (Signed)
PULMONARY / CRITICAL CARE MEDICINE   Name: Joseph Reed MRN: [id number] DOB: [birth year]    ADMISSION DATE:  12/29/2014 CONSULTATION DATE:  12/29/14  REFERRING MD :  Estanislado Pandy  CHIEF COMPLAINT:  CVA  INITIAL PRESENTATION:  69 y.o. M admitted to [facility] 10/26 with generalized weakness.  10/26, developed right sided weakness and inability to follow commands.  Found to have acute CVA, transferred to [facility], intubated and taken for IR revascularization.  STUDIES:  CT cerebral perfusion 10/26 - completed infarcts involving the inferior division right MCA and left PCA territory at the occipital lobe.  Associated cytotoxic edema noted.  Extensive left cerebral ischemia in the setting of left ICA occlusion. CTA head / neck 10/26 - acute appearing occlusion of left ICA.  Right M1 - M2 branch occlusion with complete infarct.  High grade stenosis of P2 and P3 segments.  Diffuse moderate right ICA stenosis. Portable CXR 10/27 - Improving aeration left upper lobe compared with prior chest x-ray. TTE 10/27 - A. fib. Moderate LVH. EF 30%. Mildly dilated RV & mildly decreased RV systolic function. Multiple wall motion abnormalities. Portable CXR 10/28 - continued aeration LUL & silhouetted left hemidiaphragm. ETT 7cm above carina.  SIGNIFICANT EVENTS: 10/23 - admitted to [facility] with generalized weakness. 10/26 - transferred to [facility] for acute CVA.  Taken to IR for revascularization. 10/26 - chest PT & nebs started for secretions & mucus plugging 10/27 - cultured for fever 10/28 - antibiotics started for CAP/Aspiration Pneumonia  SUBJECTIVE: [place]  VITAL SIGNS: Temp:  [99.1 F (37.3 C)-100.9 F (38.3 C)] 100.4 F (38 C) (10/29 0815) Pulse Rate:  [25-115] 99 (10/29 0815) Resp:  [11-21] 14 (10/29 0815) BP: (110-155)/(47-82) 131/75 mmHg (10/29 0800) SpO2:  [89 %-100 %] 100 % (10/29 0815) Arterial Line BP: (96-188)/(47-97) 140/58 mmHg (10/29 0815) FiO2 (%):  [40 %] 40 % (10/29 0723) HEMODYNAMICS:    VENTILATOR SETTINGS: Vent Mode:  [-] PRVC FiO2 (%):  [40 %] 40 % Set Rate:  [14 bmp] 14 bmp Vt Set:  [600 mL] 600 mL PEEP:  [5 cmH20] 5 cmH20 Pressure Support:  [12 cmH20] 12 cmH20 Plateau Pressure:  [17 cmH20-18 cmH20] 17 cmH20 INTAKE / OUTPUT: Intake/Output      10/28 0701 - 10/29 0700 10/29 0701 - 10/30 0700   I.V. (mL/kg) 2098 (24.2) 94.5 (1.1)   Total Intake(mL/kg) 2098 (24.2) 94.5 (1.1)   Urine (mL/kg/hr) 885 (0.4)    Total Output 885     Net +1213 +94.5          PHYSICAL EXAMINATION: General:  Sedated. Awake this morning. Off of sedation at the time of exam. But still not following commands Integument:  Warm & dry. No rash on exposed skin.  HEENT:  No scleral injection. Endotracheal tube in place. PERRL. Cardiovascular:  Regular rate. No edema.   Pulmonary:  Coarse breath sounds bilaterally. Symmetric chest wall rise on ventilator. Abdomen: Soft. Normal bowel sounds. Nondistended.  Neurological: No  followed  commands. Spontaneously moving lower extremity. Eyes are more open .  LABS:  CBC  Recent Labs Lab 12/30/14 0456  12/31/14 0510 12/31/14 1501 01/01/15 0630  WBC 14.6*  --  11.5*  --  10.3  HGB 9.7*  < > 8.4* 8.2* 8.3*  HCT 30.6*  < > 26.8* 26.8* 26.0*  PLT 312  --  273  --  267  < > = values in this interval not displayed. Coag's No results for input(s): APTT, INR in the last 168 hours. BMET  Recent Labs Lab 12/30/14 0456 12/31/14 0510 01/01/15 0630  NA 135 137 134*  K 4.1 3.6 3.4*  CL 105 106 106  CO2 19* 23 21*  BUN '15 14 12  '$ CREATININE 1.19 1.12 1.02  GLUCOSE 153* 127* 105*   Electrolytes  Recent Labs Lab 12/30/14 0456 12/31/14 0510 01/01/15 0630  CALCIUM 8.4* 8.3* 7.8*  MG  --  1.9 1.9  PHOS  --  2.7 2.8   Sepsis Markers  Recent Labs Lab 12/30/14 1115 12/31/14 0510 01/01/15 0630  PROCALCITON <0.10 0.13 0.10   ABG  Recent Labs Lab 12/29/14 1608  PHART 7.426  PCO2ART 34.2*  PO2ART 450*   Liver Enzymes  Recent  Labs Lab 12/31/14 0510 01/01/15 0630  ALBUMIN 2.2* 2.2*   Cardiac Enzymes  Recent Labs Lab 12/29/14 1709 12/29/14 2211 12/30/14 0456  TROPONINI 0.32* 0.29* 0.30*   Glucose  Recent Labs Lab 12/31/14 1128 12/31/14 1558 12/31/14 1930 12/31/14 2350 01/01/15 0348 01/01/15 0813  GLUCAP 137* 125* 134* 125* 92 88    Imaging Dg Chest Port 1 View  01/01/2015  CLINICAL DATA:  Intubated patient.  Follow-up atelectasis. EXAM: PORTABLE CHEST 1 VIEW COMPARISON:  12/31/2014. FINDINGS: 0642 hours. Endotracheal and nasogastric tubes appear unchanged. There is persistent volume loss in the left hemithorax with mediastinal shift to the left and left basilar airspace disease. The right basilar aeration has mildly improved. There is no pneumothorax. There may be a small amount of pleural fluid on the left. The bones appear unchanged. IMPRESSION: Persistent left lower lobe partial collapse and possible adjacent pleural fluid. Stable support system. Electronically Signed   By: Richardean Sale M.D.   On: 01/01/2015 09:24   Dg Chest Port 1 View  12/31/2014  CLINICAL DATA:  Endotracheal tube advancement EXAM: PORTABLE CHEST 1 VIEW COMPARISON:  Earlier today FINDINGS: Endotracheal tube in good position, tip just below the clavicular heads. This is mildly advanced from earlier. Orogastric tube with side port at the lower esophagus, tip not visualized on this study. Unchanged cardiomegaly. Left pleural effusion with atelectasis, with improved aeration at the left base from earlier. Hazy appearance of the right base, likely en face atelectasis. No pneumothorax. IMPRESSION: 1. Endotracheal tube tip in good position. 2. Unchanged orogastric tube with side port at the lower esophagus and tip at the proximal stomach. 3. Improved left basilar aeration since earlier today. Electronically Signed   By: Monte Fantasia M.D.   On: 12/31/2014 12:16    ASSESSMENT / PLAN:  NEUROLOGIC A:   Acute metabolic  encephalopathy - due to sedation and acute CVA. New infarcts involving the inferior division right MCA and left PCA / ICA territory at the occipital lobe with associated cytotoxic edema - s/p neuro IR revascularization 10/26. H/O anxiety  P:   Further workup & MRI per Neurology Sedation:  Fentanyl PRN. Will d/c propofol and use versed prn ASA '325mg'$  daily Lipitor '80mg'$  qhs RASS goal: 0  S/P Integrilin Daily Sedation Vacation   PULMONARY OETT 10/26 >>> A: Acute Respiratory Failure - due to inability to protect airway in the setting of acute CVA. 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 H/O COPD by report - no PFT's in system  P:   SBT  10/29, hold sedation for full awakening, continue PSv but does not have the MS for extubation at this time.  Repeat Portable CXR  in AM. Continuing Chest PT Continuing Mucomyst & Albuterol nebs q6hr for airway clearance  CARDIOVASCULAR A:  Troponin I Elevation - likely demand ischemia due to GI bleeding. Multiple wall motion abnormalities on TTE. Biventricular dysfunction. Tight BP control - per neuro IR, goal BP 120 - 140. H/O HTN & CHF (Echo from 10/23 at [place] with EF 30-35%) H/O Atrial Fibrillation H/O CAD & MI s/p PCI (on plavix and xarelto)  P:  BP parameters per Neuro & IR Continuing to hold Xarleto ASA '325mg'$  daily Lipitor '80mg'$  qhs Coreg on hold Labetalol IV prn Monitoring patient on Telemetry  RENAL A:   Hypocalcemia - Mild. S/P Calcium gluconate. Hypokalemia P:   NS @ 50cc/hr Monitoring UOP with foley catheter Monitoring renal function with daily BUN/Creatinine Replete lytes   GASTROINTESTINAL A:   Acute blood loss anemia due to GI bleed - s/p 4 PRBC transfusions at [place]. H/O GERD  P:   Monitor for further signs of GI bleeding. Follow Hgb & transfusion guidelines as outlined  below. Protonix to IV q12hr Holding on GI consult pending improvement in mental status Start TF 10/29  HEMATOLOGIC  Recent Labs  12/31/14 1501 01/01/15 0630  HGB 8.2* 8.3*    A:   Anemia - Presumed lower GI bleeding - s/p 4 PRBC transfusions at [place]. No active bleeding seen. Leukocytosis - Improving. Reactive versus Sepsis given recent fever & intervention VTE Prophylaxis  P:  Trending Hgb q24hr Transfuse for Hgb < 7. SCD's. Lovenox [place] q24hr  INFECTIOUS A:   CAP/Aspiration Pneumonia  P:   Rocephin 2gm IV q24hr 10/28>> Procalcitonin algorithm Trending leukocytosis  Respiratory Ctx 10/27>>>GPC on stain Blood Ctx 10/27>>> Urine Ctx 10/27 - negative   ENDOCRINE A:   Hyperglycemia  CBG (last 3)   Recent Labs  12/31/14 2350 01/01/15 0348 01/01/15 0813  GLUCAP 125* 92 88   P:   Accuchecks q4hr  Low dose SSI coverage   Family updated: No family at bedside to update this morning.  Interdisciplinary Family Meeting v Palliative Care Meeting:  Due by: 11/1.  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 Patient underwent embolectomy on 10/26. Patient's mental status has improved somewhat this morning as he is more awake with eyes open but still not consistently following commands. Given the gram-positive cocci seen on the stain from his respiratory culture  Starting the patient on Rocephin for community acquired pneumonia/aspiration pneumonia 10/28. Leukocytosis is trending downward which is reassuring. Continuing airway clearance maneuvers. Hold sedation 10/29 and evaluate for extubation  [facility] Minor ACNP Maryanna Shape PCCM Pager [phone] till 3 pm If no answer page [phone] 01/01/2015, 9:36 AM   Attending Note:  I have examined patient, reviewed labs, studies and notes. I have discussed the case with S Minor, and I agree with the data and plans as amended above. Admitted with  CVA s/p IR, remains intubated. Course c/b PNA. On my eval he is somnolent but wakes and weakly followed commands. He is on PSV and tolerating. Not in position to extubate at this time. I will d/c propofol, follow on prn sedation, start TF.  Independent critical care time is 35 minutes.   Baltazar Apo, MD, PhD 01/01/2015, 11:44 AM [facility] [phone] or if no answer [phone]

## 2015-01-01 NOTE — Progress Notes (Signed)
Attempted SBT trial this AM.  Back-up ventilation came on due to no patient effort.  Will attempt again once patient more awake.  Will continue to monitor.

## 2015-01-01 NOTE — Progress Notes (Addendum)
Nutrition Follow-up  DOCUMENTATION CODES:  Severe malnutrition in context of chronic illness See full assessment 10/28  INTERVENTION:  Initiate Vital AF 1.2 @ 15 ml/hr via OG tube and increase by 10 ml every 8 hours to goal rate of 75 ml/hr.   Tube feeding regimen provides 2160 kcal, 135 grams of protein, and 1504 ml of H2O.   Recommend: monitor magnesium, potassium, and phosphorus daily for at least 3 days, MD to replete as needed, as pt is at risk for refeeding syndrome given severe malnutrition.  NUTRITION DIAGNOSIS:  Malnutrition related to chronic illness as evidenced by severe depletion of body fat, severe depletion of muscle mass, energy intake < or equal to 75% for > or equal to 1 month, 13 percent weight loss in last 6 months.   ongoing  GOAL:  Patient will meet greater than or equal to 90% of their needs  MONITOR:  Skin, I & O's, Labs, Vent status, TF tolerance  REASON FOR ASSESSMENT:  Consult Enteral/tube feeding initiation and management  Low albumin  ASSESSMENT:  69 y.o. M admitted to [facility] 10/26 with generalized weakness. 10/26, developed right sided weakness and inability to follow commands. Found to have acute CVA, transferred to [facility], intubated and taken for IR revascularization.New infarcts involving the inferior division right MCA and left PCA / ICA territory at the occipital lobe with associated cytotoxic edema - s/p neuro IR revascularization 10/26. 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  Patient is currently intubated on ventilator support MV: 10.2 L/min Temp (24hrs), Avg:100 F (37.8 C), Min:99.1 F (37.3 C), Max:100.9 F (38.3 C)  Interval Hx as of 10/29: SBT trial failed. Propofol off. Started ABx  RD additionally consulted for Low albumin. Full assessment done yesterday and diagnosed severe malnutrition. See note for all details  Abdomen soft, non distended.    Diet Order:  Diet NPO time specified  Skin:  Bilateral foot wounds  Last BM:  Unknown  Height:  Ht Readings from Last 1 Encounters:  12/29/14 '6\' 1"'$  (1.854 m)   Weight:  Wt Readings from Last 1 Encounters:  12/29/14 191 lb 5.8 oz (86.8 kg)   Ideal Body Weight:  83.6 kg  BMI:  Body mass index is 25.25 kg/(m^2).  Estimated Nutritional Needs:  Kcal:  2124 Protein:  130-156g (1.5-1.8 kg BW) Fluid:  > 2.1 L/day  EDUCATION NEEDS:  No education needs identified at this time  Burtis Junes RD, LDN Nutrition Pager: [phone] 01/01/2015 1:46 PM

## 2015-01-02 ENCOUNTER — Inpatient Hospital Stay (HOSPITAL_COMMUNITY): Payer: Medicare Other

## 2015-01-02 DIAGNOSIS — C3490 Malignant neoplasm of unspecified part of unspecified bronchus or lung: Secondary | ICD-10-CM

## 2015-01-02 DIAGNOSIS — Z9911 Dependence on respirator [ventilator] status: Secondary | ICD-10-CM

## 2015-01-02 LAB — BLOOD GAS, ARTERIAL
ACID-BASE DEFICIT: 5.2 mmol/L — AB (ref 0.0–2.0)
Bicarbonate: 18.8 mEq/L — ABNORMAL LOW (ref 20.0–24.0)
DRAWN BY: 418751
FIO2: 0.4
O2 SAT: 99.5 %
PCO2 ART: 31.2 mmHg — AB (ref 35.0–45.0)
PEEP: 5 cmH2O
PH ART: 7.397 (ref 7.350–7.450)
Patient temperature: 98.6
RATE: 14 resp/min
TCO2: 19.8 mmol/L (ref 0–100)
VT: 600 mL
pO2, Arterial: 184 mmHg — ABNORMAL HIGH (ref 80.0–100.0)

## 2015-01-02 LAB — CBC WITH DIFFERENTIAL/PLATELET
BASOS ABS: 0 10*3/uL (ref 0.0–0.1)
BASOS PCT: 0 %
Eosinophils Absolute: 0.2 10*3/uL (ref 0.0–0.7)
Eosinophils Relative: 3 %
HEMATOCRIT: 27.5 % — AB (ref 39.0–52.0)
HEMOGLOBIN: 8.3 g/dL — AB (ref 13.0–17.0)
LYMPHS PCT: 18 %
Lymphs Abs: 1.7 10*3/uL (ref 0.7–4.0)
MCH: 27.6 pg (ref 26.0–34.0)
MCHC: 30.2 g/dL (ref 30.0–36.0)
MCV: 91.4 fL (ref 78.0–100.0)
MONOS PCT: 10 %
Monocytes Absolute: 0.9 10*3/uL (ref 0.1–1.0)
NEUTROS ABS: 6.5 10*3/uL (ref 1.7–7.7)
NEUTROS PCT: 69 %
Platelets: 288 10*3/uL (ref 150–400)
RBC: 3.01 MIL/uL — ABNORMAL LOW (ref 4.22–5.81)
RDW: 15.3 % (ref 11.5–15.5)
WBC: 9.4 10*3/uL (ref 4.0–10.5)

## 2015-01-02 LAB — COMPREHENSIVE METABOLIC PANEL
ALBUMIN: 2 g/dL — AB (ref 3.5–5.0)
ALT: 12 U/L — ABNORMAL LOW (ref 17–63)
ANION GAP: 6 (ref 5–15)
AST: 20 U/L (ref 15–41)
Alkaline Phosphatase: 69 U/L (ref 38–126)
BILIRUBIN TOTAL: 0.5 mg/dL (ref 0.3–1.2)
BUN: 13 mg/dL (ref 6–20)
CALCIUM: 7.9 mg/dL — AB (ref 8.9–10.3)
CO2: 21 mmol/L — AB (ref 22–32)
CREATININE: 0.87 mg/dL (ref 0.61–1.24)
Chloride: 106 mmol/L (ref 101–111)
GFR calc Af Amer: >60 mL/min (ref >60)
Glucose, Bld: 132 mg/dL — ABNORMAL HIGH (ref 65–99)
POTASSIUM: 3.6 mmol/L (ref 3.5–5.1)
Sodium: 133 mmol/L — ABNORMAL LOW (ref 135–145)
Total Protein: 5.6 g/dL — ABNORMAL LOW (ref 6.5–8.1)

## 2015-01-02 LAB — GLUCOSE, CAPILLARY
GLUCOSE-CAPILLARY: 150 mg/dL — AB (ref 65–99)
GLUCOSE-CAPILLARY: 119 mg/dL — AB (ref 65–99)
Glucose-Capillary: 105 mg/dL — ABNORMAL HIGH (ref 65–99)
Glucose-Capillary: 119 mg/dL — ABNORMAL HIGH (ref 65–99)
Glucose-Capillary: 120 mg/dL — ABNORMAL HIGH (ref 65–99)
Glucose-Capillary: 136 mg/dL — ABNORMAL HIGH (ref 65–99)

## 2015-01-02 LAB — MAGNESIUM: Magnesium: 2 mg/dL (ref 1.7–2.4)

## 2015-01-02 LAB — PHOSPHORUS: Phosphorus: 2.6 mg/dL (ref 2.5–4.6)

## 2015-01-02 LAB — TRIGLYCERIDES: Triglycerides: 81 mg/dL (ref <150)

## 2015-01-02 MED ORDER — LISINOPRIL 10 MG PO TABS
10.0000 mg | ORAL_TABLET | Freq: Two times a day (BID) | ORAL | Status: DC
  Administered 2015-01-02: 10 mg via ORAL
  Filled 2015-01-02 (×6): qty 1

## 2015-01-02 MED ORDER — CALCIUM CARBONATE 1250 (500 CA) MG PO TABS
1250.0000 mg | ORAL_TABLET | Freq: Two times a day (BID) | ORAL | Status: DC
  Filled 2015-01-02 (×8): qty 1

## 2015-01-02 NOTE — Progress Notes (Signed)
PULMONARY / CRITICAL CARE MEDICINE   Name: Joseph Reed MRN: [id number] DOB: [birth year]    ADMISSION DATE:  12/29/2014 CONSULTATION DATE:  12/29/14  REFERRING MD :  Estanislado Pandy  CHIEF COMPLAINT:  CVA  INITIAL PRESENTATION:  69 y.o. M admitted to [facility] 10/26 with generalized weakness.  10/26, developed right sided weakness and inability to follow commands.  Found to have acute CVA, transferred to [facility], intubated and taken for IR revascularization.  STUDIES:  CT cerebral perfusion 10/26 - completed infarcts involving the inferior division right MCA and left PCA territory at the occipital lobe.  Associated cytotoxic edema noted.  Extensive left cerebral ischemia in the setting of left ICA occlusion. CTA head / neck 10/26 - acute appearing occlusion of left ICA.  Right M1 - M2 branch occlusion with complete infarct.  High grade stenosis of P2 and P3 segments.  Diffuse moderate right ICA stenosis. Portable CXR 10/27 - Improving aeration left upper lobe compared with prior chest x-ray. TTE 10/27 - A. fib. Moderate LVH. EF 30%. Mildly dilated RV & mildly decreased RV systolic function. Multiple wall motion abnormalities. Portable CXR 10/28 - continued aeration LUL & silhouetted left hemidiaphragm. ETT 7cm above carina.  SIGNIFICANT EVENTS: 10/23 - admitted to [facility] with generalized weakness. 10/26 - transferred to [facility] for acute CVA.  Taken to IR for revascularization. 10/26 - chest PT & nebs started for secretions & mucus plugging 10/27 - cultured for fever 10/28 - antibiotics started for CAP/Aspiration Pneumonia/nl flora  SUBJECTIVE: [place]  VITAL SIGNS: Temp:  [98.6 F (37 C)-101.1 F (38.4 C)] 99.5 F (37.5 C) (10/30 0800) Pulse Rate:  [68-129] 108 (10/30 0830) Resp:  [0-28] 22 (10/30 0830) BP: (100-177)/(50-101) 165/70 mmHg (10/30 0830) SpO2:  [100 %] 100 % (10/30 0830) Arterial Line BP: (112-168)/(56-72) 164/71 mmHg (10/30 0800) FiO2 (%):  [40 %] 40 % (10/30 0830) Weight:   [187 lb 9.8 oz (85.1 kg)-188 lb 15 oz (85.7 kg)] 188 lb 15 oz (85.7 kg) (10/30 0451) HEMODYNAMICS:   VENTILATOR SETTINGS: Vent Mode:  [-] PSV;CPAP FiO2 (%):  [40 %] 40 % Set Rate:  [14 bmp] 14 bmp Vt Set:  [600 mL] 600 mL PEEP:  [5 cmH20] 5 cmH20 Pressure Support:  [5 cmH20] 5 cmH20 Plateau Pressure:  [16 cmH20-17 cmH20] 16 cmH20 INTAKE / OUTPUT: Intake/Output      10/29 0701 - 10/30 0700 10/30 0701 - 10/31 0700   I.V. (mL/kg) 1319.5 (15.4) 50 (0.6)   NG/GT 336.8 35   IV Piggyback 300    Total Intake(mL/kg) 1956.3 (22.8) 85 (1)   Urine (mL/kg/hr) 965 (0.5) 120 (0.6)   Total Output 965 120   Net +991.3 -35          PHYSICAL EXAMINATION: General:  Sedated. Awake this morning. Off of sedation at the time of exam. But still not following commands Integument:  Warm & dry. No rash on exposed skin.  HEENT:  No scleral injection. Endotracheal tube in place. PERRL. Cardiovascular:  Regular rate. No edema.   Pulmonary:  Coarse breath sounds bilaterally. Symmetric chest wall rise on ventilator. Abdomen: Soft. Normal bowel sounds. Nondistended.  Neurological: No  followed  commands. Spontaneously moving lower extremity. Eyes are more open . Sedated, left side weakness  LABS:  CBC  Recent Labs Lab 12/31/14 0510  01/01/15 0630 01/01/15 1420 01/02/15 0459  WBC 11.5*  --  10.3  --  9.4  HGB 8.4*  < > 8.3* 8.0* 8.3*  HCT 26.8*  < > 26.0*  26.0* 27.5*  PLT 273  --  267  --  288  < > = values in this interval not displayed. Coag's No results for input(s): APTT, INR in the last 168 hours. BMET  Recent Labs Lab 12/31/14 0510 01/01/15 0630 01/02/15 0459  NA 137 134* 133*  K 3.6 3.4* 3.6  CL 106 106 106  CO2 23 21* 21*  BUN '14 12 13  '$ CREATININE 1.12 1.02 0.87  GLUCOSE 127* 105* 132*   Electrolytes  Recent Labs Lab 12/31/14 0510 01/01/15 0630 01/02/15 0459  CALCIUM 8.3* 7.8* 7.9*  MG 1.9 1.9 2.0  PHOS 2.7 2.8 2.6   Sepsis Markers  Recent Labs Lab 12/30/14 1115  12/31/14 0510 01/01/15 0630  PROCALCITON <0.10 0.13 0.10   ABG  Recent Labs Lab 12/29/14 1608 01/02/15 0353  PHART 7.426 7.397  PCO2ART 34.2* 31.2*  PO2ART 450* 184*   Liver Enzymes  Recent Labs Lab 12/31/14 0510 01/01/15 0630 01/02/15 0459  AST  --   --  20  ALT  --   --  12*  ALKPHOS  --   --  69  BILITOT  --   --  0.5  ALBUMIN 2.2* 2.2* 2.0*   Cardiac Enzymes  Recent Labs Lab 12/29/14 1709 12/29/14 2211 12/30/14 0456  TROPONINI 0.32* 0.29* 0.30*   Glucose  Recent Labs Lab 01/01/15 1141 01/01/15 1627 01/01/15 1954 01/02/15 0017 01/02/15 0410 01/02/15 0754  GLUCAP 147* 131* 113* 119* 105* 136*    Imaging Dg Chest Port 1 View  01/02/2015  CLINICAL DATA:  Respiratory failure EXAM: PORTABLE CHEST 1 VIEW COMPARISON:  01/01/2015 FINDINGS: The ET tube tip is above the carina. There is a nasogastric tube with side port just below the GE junction. The heart size appears normal. Diminished aeration to the left lower lobe is again noted and appears unchanged from previous exam. IMPRESSION: 1. Persistent decreased aeration to the left base compared with prior exam Electronically Signed   By: Kerby Moors M.D.   On: 01/02/2015 08:08    ASSESSMENT / PLAN:  NEUROLOGIC A:   Acute metabolic encephalopathy - due to sedation and acute CVA. New infarcts involving the inferior division right MCA and left PCA / ICA territory at the occipital lobe with associated cytotoxic edema - s/p neuro IR revascularization 10/26. H/O anxiety  P:   Further workup & MRI per Neurology Sedation:  Fentanyl PRN. Will d/c propofol and use versed prn ASA '325mg'$  daily Lipitor '80mg'$  qhs RASS goal: 0  S/P Integrilin Daily Sedation Vacation   PULMONARY OETT 10/26 >>> A: Acute Respiratory Failure - due to inability to protect airway in the setting of acute CVA. 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 H/O COPD by report - no PFT's in system  P:   Continue SBT's, MS is marginal for extubation but do intermittently agitated and do not want to re-sedate him. Will attempt extubation for success. Explained to his wife that he may fail, require longer MV. She understands and agrees as long as there is progress to be made. She would not want tracheostomy or prolonged airway protection.  Repeat Portable CXR in AM. Continuing Chest PT Continuing Mucomyst & Albuterol nebs q6hr for airway clearance  CARDIOVASCULAR A:  Troponin I Elevation - likely demand ischemia due to GI bleeding. Multiple wall motion abnormalities on TTE. Biventricular dysfunction. Tight BP  control - per neuro IR, goal BP 120 - 140. H/O HTN & CHF (Echo from 10/23 at [place] with EF 30-35%) H/O Atrial Fibrillation H/O CAD & MI s/p PCI (on plavix and xarelto)  P:  BP parameters per Neuro & IR Continuing to hold Xarleto ASA '325mg'$  daily Lipitor '80mg'$  qhs Coreg on hold Labetalol IV prn Monitoring patient on Telemetry  RENAL A:   Hypocalcemia - Mild. S/P Calcium gluconate. Hypokalemia P:   NS @ 50cc/hr Monitoring UOP with foley catheter Monitoring renal function with daily BUN/Creatinine Replete lytes   GASTROINTESTINAL A:   Acute blood loss anemia due to GI bleed - s/p 4 PRBC transfusions at [place]. H/O GERD  P:   Monitor for further signs of GI bleeding. Follow Hgb & transfusion guidelines as outlined below. Protonix to IV q12hr Holding on GI consult pending improvement in mental status Started TF 10/29  HEMATOLOGIC  Recent Labs  01/01/15 1420 01/02/15 0459  HGB 8.0* 8.3*   A:   Anemia - Presumed lower GI bleeding - s/p 4 PRBC transfusions at [place]. No active bleeding seen. Leukocytosis - Improving. Reactive versus Sepsis given recent fever & intervention VTE Prophylaxis P:  Trending Hgb q24hr Transfuse for Hgb < 7. SCD's. Lovenox [place]  q24hr  INFECTIOUS A:   CAP/Aspiration Pneumonia  P:   Rocephin 2gm IV q24hr 10/28>> Procalcitonin algorithm Trending leukocytosis  Respiratory Ctx 10/27>>>neg Blood Ctx 10/27>>> Urine Ctx 10/27 - negative   ENDOCRINE A:   Hyperglycemia  CBG (last 3)   Recent Labs  01/02/15 0017 01/02/15 0410 01/02/15 0754  GLUCAP 119* 105* 136*   P:   Accuchecks q4hr  Low dose SSI coverage   Family updated:  Wife updated at bedside 10/30   Interdisciplinary Family Meeting v Palliative Care Meeting:  Due by: 11/1.  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 Patient underwent embolectomy on 10/26. Patient's mental status has improved somewhat this morning as he is more awake with eyes open but still not consistently following commands. Given the gram-positive cocci seen on the stain from his respiratory culture  Starting the patient on Rocephin for community acquired pneumonia/aspiration pneumonia 10/28. Leukocytosis is trending downward which is reassuring. Continuing airway clearance maneuvers. 10/30 was awake and may have been ready for extubation but sedated on exam. Will reevaluate for possible extubation later on 10/30.  Richardson Landry Minor ACNP Maryanna Shape PCCM Pager [phone] till 3 pm If no answer page [phone] 01/02/2015, 9:25 AM   Attending Note:  I have examined patient, reviewed labs, studies and notes. I have discussed the case with S Minor, and I agree with the data and plans as amended above. Hx of squamous cell lung CA, admitted with CVA s/p IR, remains intubated. Course c/b PNA. On my eval he is somnolent but wakes and weakly followed commands. He is on PSV and tolerating. Will try to extubate for success this am. If he fails then wife would want short-term reintubation but no trach, etc. .Independent critical care time is 40 minutes.   Baltazar Apo, MD, PhD 01/02/2015, 12:13 PM [place] [phone] or if no answer [phone]

## 2015-01-02 NOTE — Progress Notes (Addendum)
STROKE TEAM PROGRESS NOTE  HISTORY Joseph Reed is a 69 y.o. male with multiple medical problems and a recent stroke 3 weeks ago. Patient was on anticoagulation and was admitted to [facility] with a GI bleed. Patient had to have all anticoagulation and antiplatelet therapy discontinued. On the day prior to admission he was noted to have some right sided weakness. The morning of admission he was found flaccid on the right and unable to communicate. Patient transferred here for possible interventional procedure.   Date last known well: Date: 12/28/2014 Time last known well: Time: 22:00 tPA Given: No: Outside time window, recent infarct, GI bleed   SUBJECTIVE (INTERVAL HISTORY) No events overnight.  This morning patient with increased agitation.  Did follow commands for RN; however, required Fentanyl for agitation prior to MD exam.  Patient needed several doses of Labetalol for SBP elevation during this time  OBJECTIVE Temp:  [98.6 F (37 C)-101.1 F (38.4 C)] 99.5 F (37.5 C) (10/30 0800) Pulse Rate:  [68-129] 108 (10/30 0830) Cardiac Rhythm:  [-] Atrial fibrillation (10/30 0800) Resp:  [0-28] 22 (10/30 0830) BP: (100-177)/(50-101) 165/70 mmHg (10/30 0830) SpO2:  [100 %] 100 % (10/30 0830) Arterial Line BP: (112-168)/(56-72) 164/71 mmHg (10/30 0800) FiO2 (%):  [40 %] 40 % (10/30 0830) Weight:  [85.1 kg (187 lb 9.8 oz)-85.7 kg (188 lb 15 oz)] 85.7 kg (188 lb 15 oz) (10/30 0451)  CBC:   Recent Labs Lab 01/01/15 0630 01/01/15 1420 01/02/15 0459  WBC 10.3  --  9.4  NEUTROABS 7.4  --  6.5  HGB 8.3* 8.0* 8.3*  HCT 26.0* 26.0* 27.5*  MCV 90.9  --  91.4  PLT 267  --  277    Basic Metabolic Panel:   Recent Labs Lab 01/01/15 0630 01/02/15 0459  NA 134* 133*  K 3.4* 3.6  CL 106 106  CO2 21* 21*  GLUCOSE 105* 132*  BUN 12 13  CREATININE 1.02 0.87  CALCIUM 7.8* 7.9*  MG 1.9 2.0  PHOS 2.8 2.6    Lipid Panel:     Component Value Date/Time   CHOL 143 12/30/2014  0453   TRIG 81 01/02/2015 0459   HDL 23* 12/30/2014 0453   CHOLHDL 6.2 12/30/2014 0453   VLDL 23 12/30/2014 0453   LDLCALC 97 12/30/2014 0453   HgbA1c:  Lab Results  Component Value Date   HGBA1C 5.8* 12/30/2014   Urine Drug Screen: No results found for: LABOPIA, COCAINSCRNUR, LABBENZ, AMPHETMU, THCU, LABBARB    IMAGING I have personally reviewed the radiological images below and agree with the radiology interpretations.  Ct Head Wo Contrast 12/29/2014   No evidence for complication related endovascular treatment, such as subarachnoid or parenchymal hemorrhage. No evidence for cortical ischemia within the LEFT MCA or LEFT ACA territory. Acute RIGHT MCA and LEFT PCA infarcts appear unchanged as previously outlined on earlier perfusion scan.   Ct Angio Head & Neck W/cm &/or Wo/cm 12/29/2014   1. Acute appearing occlusion of the left ICA from the cervical portion to the ophthalmic segment. 2. Right M1 -M2 branch occlusion of the inferior division with completed infarct. 3. High-grade stenosis of the P2 and P3 segments with completed medial occipital lobe infarct. 4. Diffuse moderate right ICA stenosis with appearance of dissection, favored chronic given low-density mural appearance. 5. High-grade stenosis of the bilateral V1 segments. 6. Extensive intracranial atherosclerosis with multi focal moderate stenosis.   Ct Cerebral Perfusion W/cm 12/29/2014  1. Completed infarcts involving the inferior division right  MCA territory and in the left PCA territory at the occipital lobe. Associated cytotoxic edema is newly seen since yesterday's CT. 2. Extensive left cerebral ischemia/penumbra in the setting of left ICA occlusion.   Dg Chest Port 1 View 12/30/2014  Improved aeration of the left lung. Persistent dense consolidation or atelectasis in the left lower lobe and likely small left pleural effusion.  12/29/2014   1. New significant opacity in the left lung consisting of left pleural effusion  and atelectasis or infiltrate. 2. Postoperative changes in the left lung. 3. Placement of endotracheal tube and nasogastric tube.  12/31/2014  IMPRESSION: NG tube with tip in proximal stomach just below the GE junction. Endotracheal tube with tip 7.8 cm above the carina. No pneumothorax. Small left pleural effusion with left basilar atelectasis or infiltrate.  R & L common carotidarteriogram LT ICA and LT MCA occlusion with recannulization TICI 3  2D echo - - The patient was in atrial fibrillation. Moderate LV hypertrophy. EF 30% with wall motion abnormalities as noted above. Aortic sclerosis without significant stenosis. Mildly dilated RV with mildly decreased systolic function. Moderate biatrial enlargement.  MRI and MRA brain 12/30/2014  IMPRESSION: 1. Oral enteric tubing is looped in the pharynx. Recommend repositioning. 2. Moderate to large right MCA infarct, sparing the anterior division. Associated occlusion of the right MCA M2 posterior division. 3. Moderate to large left PCA infarct. Associated occlusion of the left PCA P3 superior division. 4. Improved patency of the left ICA siphon since the CTA yesterday. Moderate to severe bilateral ICA irregularity and stenosis. 5. Trace petechial hemorrhage in the left PCA territory. No significant intracranial mass effect at this time and no malignant hemorrhagic transformation.     PHYSICAL EXAM  Temp:  [98.6 F (37 C)-101.1 F (38.4 C)] 99.5 F (37.5 C) (10/30 0800) Pulse Rate:  [68-129] 108 (10/30 0830) Resp:  [0-28] 22 (10/30 0830) BP: (100-177)/(50-101) 165/70 mmHg (10/30 0830) SpO2:  [100 %] 100 % (10/30 0830) Arterial Line BP: (112-168)/(56-72) 164/71 mmHg (10/30 0800) FiO2 (%):  [40 %] 40 % (10/30 0830) Weight:  [85.1 kg (187 lb 9.8 oz)-85.7 kg (188 lb 15 oz)] 85.7 kg (188 lb 15 oz) (10/30 0451)  General - Well nourished, well developed, intubated, s/p Fentanyl HEENT-  NCAT, pupils reactive, sclera clear,  intubated Cardiovascular - irregularly irregular heart rate and rhythm Pulmonary:  CTA GI- ND, normal bowel sounds GU- deferred Extrem- No C/C/E  NEURO EXAM MENTAL STATUS: intubated, eyes open without stimulation, not following commands after Fentanyl  CRANIAL NERVES: PERRL; blinks to visual threat  Right gaze preference but will cross midline with OCRs Positive corneal on the right; sluggish on the left Positive gag  MOTOR/SENSORY EXAM: Spontaneously moving right UE and LE, and moves against gravity; with noxious stimuli trace withdraw LUE and 2/5 LLE.   GAIT:   Deferred   ASSESSMENT/PLAN Mr. Joseph Reed is a 69 y.o. male with history of MI s/p stenting in 06/2014, HTN, smoker, lung  Cancer, recent stroke found to have afib on Xarelto and plavix but pt stopped Xarelto two weeks ago, recent LGIB admitted to [facility] for blood transfusion, transferred to [facility] from [place] for aphasia and right LE weakness.  S/p complete revascularization of LT ICA occlusion.    Stroke:  R MCA, L PCA infarcts, showing on CTP. S/P L ICA TICI3 revascularization. Stroke etiology likely due to afib not on anticoagulation due to GIB. However, pt does have significant large vessel athero, which could be etiology  for stroke too. However, his initial right side weakness with aphasia did not fit to the stroke identified by CT, needs MRI for further evaluation.  Resultant  Intubated with left sided weakness  MRI confirmed left PCA and right MCA infarcts  MRA left ICA remains open but b/l ICA athero, right M2 and left P2 occlusion  CTA head and neck and CTP - left ICA occlusion, right MCA infarct and left PCA infarct but left MCA and ACA are open  Angio - left ICA occlusion with TICI3 recannulization  2D Echo EF 30%   LDL 97  HgbA1c 5.8  lovenox for VTE prophylaxis Diet NPO time specified  clopidogrel 75 mg daily and Xarelto (rivaroxaban) daily prior to admission for atrial  fibrillation, now on ASA '325mg'$ .  Ongoing aggressive stroke risk factor management  Therapy recommendations:  pending   Disposition:  pending   History of stroke  06/2014 - after cardiac cath, MRI showed two punctate infarct - brainstem and right parietal WM  12/07/14 - small right ACA and punctate right peri-ventricle   Intracranial atherosclerosis  Left ICA occlusion s/p recannulization  Right ICA diffuse stenosis  Right M2 occlusion  Left P2 occlusion  Bilateral VA stenosis  Diffuse intracranial athero with multi focal moderate stenosis  Atrial Fibrillation  Home anticoagulation:  xarelto and plavix  Pt stopped Xarelto by himself prior to GIB  Hold off AC due to large stroke and recent GIB requiring blood transfusion.   HR 80's - 100's now  Monitoring HR, may consider Cardizem if RVR  Lung cancer  Squamous cell lung cancer s/p LLL lobectomy 09/2013 - pt was supposed to have post op chemo / XRT but he apparently declined.  With adjacent lymph node involvement  Staging not done yet  Left lung atelectasis  Overnight CXR comfirmed  On chest PT, suctioning, albuterol and mucomyst  Repeat CXR improved aeration   CCM on board  LGIB  Hgb 5.8 at [place], S/p 4u PRBCs at [facility]  Suspected variceal bleeding  Taking motrin bid for arthritis pain prior to admission to [facility] 9.7-> 8.4  currently on ASA for stroke   Hb and GIB Q12   Respiratory Failure  Intubated for neuro intervention  CXR with near complete opacity with Mucous plug  Can not extubate today  CAD with MI s/p stent  On plavix prior to [place]  Had MI in 06/2014 s/p PCI and stent  Tobacco abuse  Current smoker  Hypertension  Stable on the low side Permissive hypertension (OK if < 220/120) but gradually normalize in 5-7 days  Hyperlipidemia  Home meds:  unclear  LDL 97, goal < 70  Put on lipitor 80  continue statin at discharge  Other Stroke Risk  Factors  Advanced age  hx ETOH abuse  Hx CHF  Other Active Problems  Hx COPD, emphysema  Troponin elevation, felt to be demand ischemia due to stroke  Hypocalcemia  Febrile, TMax 101.3   Attending System Review AND PLAN FOR 01/02/2015  Neuro  S/p complete revascularization of LT ICA occlusion.   Continue neuro exams per protocol  Cardiac  Atrial fibrillation - Xarelto and Plavix recently discontinued secondary to GI bleed.  History of coronary artery disease status post stenting.  EF 30%. CHF history.  Per vital recordings, HR range labile with significant bradycardia in the evening yesterday; however, with further review the bradycardia is demonstrated on the pulse ox and not on EKG; will continue continuous cardiopulmonary monitoring  Set SBP goal for <160 - Below goal most of the time; requires periodic prns  Will add Lisinopril '10mg'$  BID for goal of consistent normotension  Pulmonary  History of lung cancer and ongoing tobacco use  Portable chest x-ray 12/29/2014 - 1. New significant opacity in the left lung consisting of left pleural effusion and atelectasis or infiltrate. 2. Postoperative changes in the left lung. 3. Placement of endotracheal tube and nasogastric tube.  CXR 01/02/2015 - Persistent decreased aeration to the left base compared with prior exam  GI  History of recent GI bleed - anticoagulation discontinued.  Held tube feeds while Critical Care Team evaluates for extubation  GU   I / O - 1956 / 965 = 991  Blood in foley; primary team monitoring H/H  Endo  No history of diabetes mellitus - hemoglobin A1c 5.8  Heme / ID  Anemia H/H 8.3 / 27.5 on 01/02/2015 - stable  Electrolytes  Potassium 3.6 on 01/02/2015 - improved  Sodium 133; hyponatremia; down from 134 - will monitor  Ionized calcium low; will replete 7.9 on 01/02/2015 - slightly improved but still low  Mg 2.0  Phos 2.6; will monitor  Hospital day # 4  CRITICAL  CARE NEUROLOGY ATTENDING NOTE Patient was seen and examined by me personally. I reviewed notes, independently viewed imaging studies, participated in medical decision making and plan of care. I have made additions or clarifications directly to the above note.  Documentation accurately reflects findings. The laboratory and radiographic studies were personally reviewed by me.  I had lengthy talk with wife at bedside.  She is asking appropriate questions regarding prognosis.  She believes Oncology projected a 50/50 chance of living for one year.  She understands that this hospitalizations has decreased the chances for this given the medical co-morbidities.  We discussed the possibility of PEG if patient successfully but cannot swallow safely.  She stated "he may not want that."  She would appreciate a discussion with Oncology regarding the current prognosis given overall picture   ROS pertinent positives could not be fully documented due to LOC  Assessment and plan completed by me personally and fully documented above.  Condition is unchanged   This patient is critically ill and at significant risk of neurological worsening, death and care requires constant monitoring of vital signs, hemodynamics,respiratory and cardiac monitoring, extensive review of multiple databases, frequent neurological assessment, discussion with family, other specialists and medical decision making of high complexity.  This critical care time does not reflect procedure time, or teaching time or supervisory time of PA/NP/Med Resident etc. but could involve care discussion time.  I spent 50 minutes of Neurocritical Care time in the care of  this patient.  SIGNED BY: Dr. Elissa Hefty       To contact Stroke Continuity provider, please refer to [email or web address] After hours, contact General Neurology

## 2015-01-02 NOTE — Progress Notes (Signed)
PT Cancellation Note  Patient Details Name: Joseph Reed MRN: [id number] DOB: [birth year]   Cancelled Treatment:    Reason Eval/Treat Not Completed: Patient not medically ready. Pt remains intubated, sedated and on bedrest. Per RN plan is to attempt extubation today. PT to return as able when appropriate for evaluation.   Kingsley Callander 01/02/2015, 11:00 AM   Kittie Plater, PT, DPT Pager #: [phone] Office #: [phone]

## 2015-01-02 NOTE — Procedures (Signed)
Extubation Procedure Note  Patient Details:   Name: Joseph Reed DOB: 04-Feb-1946 MRN: [id number]   Airway Documentation:     Evaluation  O2 sats: stable throughout Complications: No apparent complications Patient did tolerate procedure well. Bilateral Breath Sounds: Rhonchi Suctioning: Airway Yes   Patient extubated to 2L nasal cannula per MD order.  Positive cuff leak noted.  No evidence of stridor.  Sats currently 100%.  Vitals are stable.  No apparent complications.  Alphia Moh N 01/02/2015, 12:18 PM

## 2015-01-03 ENCOUNTER — Inpatient Hospital Stay (HOSPITAL_COMMUNITY): Payer: Medicare Other

## 2015-01-03 DIAGNOSIS — C3490 Malignant neoplasm of unspecified part of unspecified bronchus or lung: Secondary | ICD-10-CM

## 2015-01-03 DIAGNOSIS — M6289 Other specified disorders of muscle: Secondary | ICD-10-CM

## 2015-01-03 LAB — RENAL FUNCTION PANEL
ALBUMIN: 2.1 g/dL — AB (ref 3.5–5.0)
ANION GAP: 8 (ref 5–15)
BUN: 15 mg/dL (ref 6–20)
CALCIUM: 8.2 mg/dL — AB (ref 8.9–10.3)
CO2: 21 mmol/L — ABNORMAL LOW (ref 22–32)
Chloride: 110 mmol/L (ref 101–111)
Creatinine, Ser: 0.9 mg/dL (ref 0.61–1.24)
GFR calc Af Amer: >60 mL/min (ref >60)
GLUCOSE: 107 mg/dL — AB (ref 65–99)
PHOSPHORUS: 2.4 mg/dL — AB (ref 2.5–4.6)
Potassium: 3.6 mmol/L (ref 3.5–5.1)
SODIUM: 139 mmol/L (ref 135–145)

## 2015-01-03 LAB — CBC WITH DIFFERENTIAL/PLATELET
Basophils Absolute: 0 10*3/uL (ref 0.0–0.1)
Basophils Relative: 0 %
EOS ABS: 0.1 10*3/uL (ref 0.0–0.7)
EOS PCT: 1 %
HCT: 26.6 % — ABNORMAL LOW (ref 39.0–52.0)
HEMOGLOBIN: 8.3 g/dL — AB (ref 13.0–17.0)
LYMPHS ABS: 1.4 10*3/uL (ref 0.7–4.0)
LYMPHS PCT: 17 %
MCH: 28.1 pg (ref 26.0–34.0)
MCHC: 31.2 g/dL (ref 30.0–36.0)
MCV: 90.2 fL (ref 78.0–100.0)
MONOS PCT: 10 %
Monocytes Absolute: 0.8 10*3/uL (ref 0.1–1.0)
NEUTROS PCT: 72 %
Neutro Abs: 6.2 10*3/uL (ref 1.7–7.7)
Platelets: 309 10*3/uL (ref 150–400)
RBC: 2.95 MIL/uL — ABNORMAL LOW (ref 4.22–5.81)
RDW: 14.9 % (ref 11.5–15.5)
WBC: 8.6 10*3/uL (ref 4.0–10.5)

## 2015-01-03 LAB — GLUCOSE, CAPILLARY
GLUCOSE-CAPILLARY: 124 mg/dL — AB (ref 65–99)
GLUCOSE-CAPILLARY: 109 mg/dL — AB (ref 65–99)
Glucose-Capillary: 100 mg/dL — ABNORMAL HIGH (ref 65–99)
Glucose-Capillary: 117 mg/dL — ABNORMAL HIGH (ref 65–99)
Glucose-Capillary: 122 mg/dL — ABNORMAL HIGH (ref 65–99)
Glucose-Capillary: 128 mg/dL — ABNORMAL HIGH (ref 65–99)

## 2015-01-03 LAB — MAGNESIUM: Magnesium: 1.9 mg/dL (ref 1.7–2.4)

## 2015-01-03 NOTE — Progress Notes (Signed)
STROKE TEAM PROGRESS NOTE  HISTORY Joseph Reed is a 69 y.o. male with multiple medical problems and a recent stroke 3 weeks ago. Patient was on anticoagulation and was admitted to [facility] with a GI bleed. Patient had to have all anticoagulation and antiplatelet therapy discontinued. On the day prior to admission he was noted to have some right sided weakness. The morning of admission he was found flaccid on the right and unable to communicate. Patient transferred here for possible interventional procedure.   Date last known well: Date: 12/28/2014 Time last known well: Time: 22:00 tPA Given: No: Outside time window, recent infarct, GI bleed   SUBJECTIVE (INTERVAL HISTORY) No events overnight.     Patient remains extubated but having a lot of oral secretions and decreased ability to protect airway. Critical care is concerned about possibility of reintubation and wants to talk to family about DO NOT RESUSCITATE  OBJECTIVE Temp:  [98.3 F (36.8 C)-99.6 F (37.6 C)] 99.5 F (37.5 C) (10/31 1126) Pulse Rate:  [84-146] 105 (10/31 1300) Cardiac Rhythm:  [-] Atrial fibrillation (10/31 0800) Resp:  [17-28] 27 (10/31 1300) BP: (90-180)/(57-123) 147/102 mmHg (10/31 1300) SpO2:  [90 %-100 %] 100 % (10/31 1300) Arterial Line BP: (143-179)/(59-78) 159/77 mmHg (10/31 1100) Weight:  [190 lb 11.2 oz (86.5 kg)] 190 lb 11.2 oz (86.5 kg) (10/31 0500)  CBC:   Recent Labs Lab 01/02/15 0459 01/03/15 0515  WBC 9.4 8.6  NEUTROABS 6.5 6.2  HGB 8.3* 8.3*  HCT 27.5* 26.6*  MCV 91.4 90.2  PLT 288 409    Basic Metabolic Panel:   Recent Labs Lab 01/02/15 0459 01/03/15 0515  NA 133* 139  K 3.6 3.6  CL 106 110  CO2 21* 21*  GLUCOSE 132* 107*  BUN 13 15  CREATININE 0.87 0.90  CALCIUM 7.9* 8.2*  MG 2.0 1.9  PHOS 2.6 2.4*    Lipid Panel:     Component Value Date/Time   CHOL 143 12/30/2014 0453   TRIG 81 01/02/2015 0459   HDL 23* 12/30/2014 0453   CHOLHDL 6.2 12/30/2014 0453    VLDL 23 12/30/2014 0453   LDLCALC 97 12/30/2014 0453   HgbA1c:  Lab Results  Component Value Date   HGBA1C 5.8* 12/30/2014   Urine Drug Screen: No results found for: LABOPIA, COCAINSCRNUR, LABBENZ, AMPHETMU, THCU, LABBARB    IMAGING I have personally reviewed the radiological images below and agree with the radiology interpretations.  Ct Head Wo Contrast 12/29/2014   No evidence for complication related endovascular treatment, such as subarachnoid or parenchymal hemorrhage. No evidence for cortical ischemia within the LEFT MCA or LEFT ACA territory. Acute RIGHT MCA and LEFT PCA infarcts appear unchanged as previously outlined on earlier perfusion scan.   Ct Angio Head & Neck W/cm &/or Wo/cm 12/29/2014   1. Acute appearing occlusion of the left ICA from the cervical portion to the ophthalmic segment. 2. Right M1 -M2 branch occlusion of the inferior division with completed infarct. 3. High-grade stenosis of the P2 and P3 segments with completed medial occipital lobe infarct. 4. Diffuse moderate right ICA stenosis with appearance of dissection, favored chronic given low-density mural appearance. 5. High-grade stenosis of the bilateral V1 segments. 6. Extensive intracranial atherosclerosis with multi focal moderate stenosis.   Ct Cerebral Perfusion W/cm 12/29/2014  1. Completed infarcts involving the inferior division right MCA territory and in the left PCA territory at the occipital lobe. Associated cytotoxic edema is newly seen since yesterday's CT. 2. Extensive left cerebral ischemia/penumbra  in the setting of left ICA occlusion.   Dg Chest Port 1 View 12/30/2014  Improved aeration of the left lung. Persistent dense consolidation or atelectasis in the left lower lobe and likely small left pleural effusion.  12/29/2014   1. New significant opacity in the left lung consisting of left pleural effusion and atelectasis or infiltrate. 2. Postoperative changes in the left lung. 3. Placement of  endotracheal tube and nasogastric tube.  12/31/2014  IMPRESSION: NG tube with tip in proximal stomach just below the GE junction. Endotracheal tube with tip 7.8 cm above the carina. No pneumothorax. Small left pleural effusion with left basilar atelectasis or infiltrate.  R & L common carotidarteriogram LT ICA and LT MCA occlusion with recannulization TICI 3  2D echo - - The patient was in atrial fibrillation. Moderate LV hypertrophy. EF 30% with wall motion abnormalities as noted above. Aortic sclerosis without significant stenosis. Mildly dilated RV with mildly decreased systolic function. Moderate biatrial enlargement.  MRI and MRA brain 12/30/2014  IMPRESSION: 1. Oral enteric tubing is looped in the pharynx. Recommend repositioning. 2. Moderate to large right MCA infarct, sparing the anterior division. Associated occlusion of the right MCA M2 posterior division. 3. Moderate to large left PCA infarct. Associated occlusion of the left PCA P3 superior division. 4. Improved patency of the left ICA siphon since the CTA yesterday. Moderate to severe bilateral ICA irregularity and stenosis. 5. Trace petechial hemorrhage in the left PCA territory. No significant intracranial mass effect at this time and no malignant hemorrhagic transformation.     PHYSICAL EXAM  Temp:  [98.3 F (36.8 C)-99.6 F (37.6 C)] 99.5 F (37.5 C) (10/31 1126) Pulse Rate:  [84-146] 105 (10/31 1300) Resp:  [17-28] 27 (10/31 1300) BP: (90-180)/(57-123) 147/102 mmHg (10/31 1300) SpO2:  [90 %-100 %] 100 % (10/31 1300) Arterial Line BP: (143-179)/(59-78) 159/77 mmHg (10/31 1100) Weight:  [190 lb 11.2 oz (86.5 kg)] 190 lb 11.2 oz (86.5 kg) (10/31 0500)  General - Well nourished, well developed, intubated, s/p Fentanyl HEENT-  NCAT, pupils reactive, sclera clear, intubated Cardiovascular - irregularly irregular heart rate and rhythm Pulmonary:  CTA GI- ND, normal bowel sounds GU- deferred Extrem- No  C/C/E  NEURO EXAM MENTAL STATUS:  , eyes open without stimulation, not following commands  . Mute aphasic.  CRANIAL NERVES: PERRL; blinks to visual threat  Right gaze preference but will cross midline with OCRs Absent corneal on the right; sluggish on the left Positive gag  MOTOR/SENSORY EXAM: Spontaneously moving right UE and LE, and moves against gravity; with noxious stimuli trace withdraw LUE and 2/5 LLE.   GAIT:   Deferred   ASSESSMENT/PLAN Mr. Xayvion Shirah is a 69 y.o. male with history of MI s/p stenting in 06/2014, HTN, smoker, lung  Cancer, recent stroke found to have afib on Xarelto and plavix but pt stopped Xarelto two weeks ago, recent LGIB admitted to [facility] for blood transfusion, transferred to [facility] from [place] for aphasia and right LE weakness.  S/p complete revascularization of LT ICA occlusion.    Stroke:  R MCA, L PCA infarcts, showing on CTP. S/P L ICA TICI3 revascularization. Stroke etiology likely due to afib not on anticoagulation due to GIB. However, pt does have significant large vessel athero, which could be etiology for stroke too. However, his initial right side weakness with aphasia did not fit to the stroke identified by CT, needs MRI for further evaluation.  Resultant  Intubated with left sided weakness  MRI confirmed  left PCA and right MCA infarcts  MRA left ICA remains open but b/l ICA athero, right M2 and left P2 occlusion  CTA head and neck and CTP - left ICA occlusion, right MCA infarct and left PCA infarct but left MCA and ACA are open  Angio - left ICA occlusion with TICI3 recannulization  2D Echo EF 30%   LDL 97  HgbA1c 5.8  lovenox for VTE prophylaxis Diet NPO time specified  clopidogrel 75 mg daily and Xarelto (rivaroxaban) daily prior to admission for atrial fibrillation, now on ASA '325mg'$ .  Ongoing aggressive stroke risk factor management  Therapy recommendations:  pending   Disposition:  pending   History of  stroke  06/2014 - after cardiac cath, MRI showed two punctate infarct - brainstem and right parietal WM  12/07/14 - small right ACA and punctate right peri-ventricle   Intracranial atherosclerosis  Left ICA occlusion s/p recannulization  Right ICA diffuse stenosis  Right M2 occlusion  Left P2 occlusion  Bilateral VA stenosis  Diffuse intracranial athero with multi focal moderate stenosis  Atrial Fibrillation  Home anticoagulation:  xarelto and plavix  Pt stopped Xarelto by himself prior to GIB  Hold off AC due to large stroke and recent GIB requiring blood transfusion.   HR 80's - 100's now  Monitoring HR, may consider Cardizem if RVR  Lung cancer  Squamous cell lung cancer s/p LLL lobectomy 09/2013 - pt was supposed to have post op chemo / XRT but he apparently declined.  With adjacent lymph node involvement  Staging not done yet  Left lung atelectasis  Overnight CXR comfirmed  On chest PT, suctioning, albuterol and mucomyst  Repeat CXR improved aeration   CCM on board  LGIB  Hgb 5.8 at [place], S/p 4u PRBCs at [facility]  Suspected variceal bleeding  Taking motrin bid for arthritis pain prior to admission to [facility] 9.7-> 8.4  currently on ASA for stroke   Hb and GIB Q12   Respiratory Failure  Intubated for neuro intervention  CXR with near complete opacity with Mucous plug  Can not extubate today  CAD with MI s/p stent  On plavix prior to [facility]  Had MI in 06/2014 s/p PCI and stent  Tobacco abuse  Current smoker  Hypertension  Stable on the low side Permissive hypertension (OK if < 220/120) but gradually normalize in 5-7 days  Hyperlipidemia  Home meds:  unclear  LDL 97, goal < 70  Put on lipitor 80  continue statin at discharge  Other Stroke Risk Factors  Advanced age  hx ETOH abuse  Hx CHF  Other Active Problems  Hx COPD, emphysema  Troponin elevation, felt to be demand ischemia due to  stroke  Hypocalcemia  Febrile, TMax 101.3  The patient's prognosis remains poor and I agree with note of intubating the patient and he status. I will meet with the family to discuss goals of care and palliative care. Discussed with Dr. Lamonte Sakai who is in agreement   This patient is critically ill and at significant risk of neurological worsening, death and care requires constant monitoring of vital signs, hemodynamics,respiratory and cardiac monitoring, extensive review of multiple databases, frequent neurological assessment, discussion with family, other specialists and medical decision making of high complexity.  This critical care time does not reflect procedure time, or teaching time or supervisory time of PA/NP/Med Resident etc. but could involve care discussion time.  I spent 35 minutes of Neurocritical Care time in the care of  this  patient.  SIGNED BY:   Antony Contras, MD  To contact Stroke Continuity provider, please refer to [email or web address] After hours, contact General Neurology

## 2015-01-03 NOTE — Progress Notes (Signed)
Nutrition Follow-up  DOCUMENTATION CODES:   Severe malnutrition in context of chronic illness  INTERVENTION:   Supplement diet once advanced.  Provide nutrition support as desired.    NUTRITION DIAGNOSIS:   Malnutrition related to chronic illness as evidenced by severe depletion of body fat, severe depletion of muscle mass, energy intake < or equal to 75% for > or equal to 1 month, percent weight loss. Ongoing.   GOAL:   Patient will meet greater than or equal to 90% of their needs Not met.   MONITOR:   Skin, I & O's, Labs, Diet advancement  ASSESSMENT:   69 y.o. M admitted to [facility] 10/26 with generalized weakness. 10/26, developed right sided weakness and inability to follow commands. Found to have acute CVA, transferred to [facility], intubated and taken for IR revascularization.New infarcts involving the inferior division right MCA and left PCA / ICA territory at the occipital lobe with associated cytotoxic edema - s/p neuro IR revascularization 10/26. 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  Pt extubated 10/30. Remains NPO with plans for SLP eval (RN suspects pt will not pass swallow eval).  Pt discussed during ICU rounds and with RN.  Phosphorus low (2.4)  Diet Order:  Diet NPO time specified  Skin:  Wound (see comment) (Bilateral foot wounds)  Last BM:  unknown  Height:   Ht Readings from Last 1 Encounters:  12/29/14 _0  (1.854 m)   Weight:   Wt Readings from Last 1 Encounters:  01/03/15 190 lb 11.2 oz (86.5 kg)   Ideal Body Weight:  83.6 kg  BMI:  Body mass index is 25.16 kg/(m^2).  Estimated Nutritional Needs:   Kcal:  2100-2300  Protein:  130-145 grams  Fluid:  > 2.1 L/day  EDUCATION NEEDS:   No education needs identified at this time  [place], [place], [place] Pager [phone] After Hours Pager

## 2015-01-03 NOTE — Care Management Important Message (Signed)
Important Message  Patient Details  Name: Joseph Reed MRN: [id number] Date of Birth: April 02, 1945   Medicare Important Message Given:  Yes-second notification given    Nathen May 01/03/2015, 5:29 PM

## 2015-01-03 NOTE — Progress Notes (Signed)
OT Cancellation Note  Patient Details Name: Joseph Reed MRN: [id number] DOB: May 04, 1945   Cancelled Treatment:    Reason Eval/Treat Not Completed: Patient not medically ready (Pt remains on bedrest.  Will continue to follow.)  [name] 01/03/2015, 8:14 AM

## 2015-01-03 NOTE — Progress Notes (Signed)
PULMONARY / CRITICAL CARE MEDICINE   Name: Joseph Reed MRN: [id number] DOB: 30-Jun-1945    ADMISSION DATE:  12/29/2014 CONSULTATION DATE:  12/29/14  REFERRING MD :  Estanislado Pandy  CHIEF COMPLAINT:  CVA  INITIAL PRESENTATION:  69 y.o. M admitted to [facility] 10/26 with generalized weakness.  10/26, developed right sided weakness and inability to follow commands.  Found to have acute CVA, transferred to [facility], intubated and taken for IR revascularization.  STUDIES:  CT cerebral perfusion 10/26 - completed infarcts involving the inferior division right MCA and left PCA territory at the occipital lobe.  Associated cytotoxic edema noted.  Extensive left cerebral ischemia in the setting of left ICA occlusion. CTA head / neck 10/26 - acute appearing occlusion of left ICA.  Right M1 - M2 branch occlusion with complete infarct.  High grade stenosis of P2 and P3 segments.  Diffuse moderate right ICA stenosis. Portable CXR 10/27 - Improving aeration left upper lobe compared with prior chest x-ray. TTE 10/27 - A. fib. Moderate LVH. EF 30%. Mildly dilated RV & mildly decreased RV systolic function. Multiple wall motion abnormalities. Portable CXR 10/28 - continued aeration LUL & silhouetted left hemidiaphragm. ETT 7cm above carina.  SIGNIFICANT EVENTS: 10/23 - admitted to [facility] with generalized weakness. 10/26 - transferred to [facility] for acute CVA.  Taken to IR for revascularization. 10/26 - chest PT & nebs started for secretions & mucus plugging 10/27 - cultured for fever 10/28 - antibiotics started for CAP/Aspiration Pneumonia/nl flora  SUBJECTIVE:  Extubated 10/30 Marginal airway protection and secretion clearance,   VITAL SIGNS: Temp:  [98.3 F (36.8 C)-99.9 F (37.7 C)] 99.6 F (37.6 C) (10/31 0810) Pulse Rate:  [84-146] 119 (10/31 0900) Resp:  [13-27] 25 (10/31 0900) BP: (93-185)/(55-144) 120/78 mmHg (10/31 0900) SpO2:  [96 %-100 %] 96 % (10/31 0900) Arterial Line BP: (139-179)/(59-78)  148/69 mmHg (10/31 0900) Weight:  [86.5 kg (190 lb 11.2 oz)] 86.5 kg (190 lb 11.2 oz) (10/31 0500) HEMODYNAMICS:   VENTILATOR SETTINGS:   INTAKE / OUTPUT: Intake/Output      10/30 0701 - 10/31 0700 10/31 0701 - 11/01 0700   I.V. (mL/kg) 1200 (13.9) 100 (1.2)   NG/GT 70    IV Piggyback 50    Total Intake(mL/kg) 1320 (15.3) 100 (1.2)   Urine (mL/kg/hr) 775 (0.4)    Total Output 775     Net +545 +100        Urine Occurrence 3 x      PHYSICAL EXAMINATION: General:  Awake, non-verbal, weak L arm, moves the right, better cough but not fully clearing secretions.  Integument:  Warm & dry. No rash on exposed skin.  HEENT:  No scleral injection. Endotracheal tube in place. PERRL. Cardiovascular:  Regular rate. No edema.   Pulmonary:  Coarse breath sounds bilaterally. Symmetric chest wall rise on ventilator. Abdomen: Soft. Normal bowel sounds. Nondistended.  Neurological: Follows some commands, weak L arm, moves the right, better cough but not fully clearing secretions. Spontaneously moving lower extremity.   LABS:  CBC  Recent Labs Lab 01/01/15 0630 01/01/15 1420 01/02/15 0459 01/03/15 0515  WBC 10.3  --  9.4 8.6  HGB 8.3* 8.0* 8.3* 8.3*  HCT 26.0* 26.0* 27.5* 26.6*  PLT 267  --  288 309   Coag's No results for input(s): APTT, INR in the last 168 hours. BMET  Recent Labs Lab 01/01/15 0630 01/02/15 0459 01/03/15 0515  NA 134* 133* 139  K 3.4* 3.6 3.6  CL 106 106 110  CO2 21* 21* 21*  BUN '12 13 15  '$ CREATININE 1.02 0.87 0.90  GLUCOSE 105* 132* 107*   Electrolytes  Recent Labs Lab 01/01/15 0630 01/02/15 0459 01/03/15 0515  CALCIUM 7.8* 7.9* 8.2*  MG 1.9 2.0 1.9  PHOS 2.8 2.6 2.4*   Sepsis Markers  Recent Labs Lab 12/30/14 1115 12/31/14 0510 01/01/15 0630  PROCALCITON <0.10 0.13 0.10   ABG  Recent Labs Lab 12/29/14 1608 01/02/15 0353  PHART 7.426 7.397  PCO2ART 34.2* 31.2*  PO2ART 450* 184*   Liver Enzymes  Recent Labs Lab 01/01/15 0630  01/02/15 0459 01/03/15 0515  AST  --  20  --   ALT  --  12*  --   ALKPHOS  --  69  --   BILITOT  --  0.5  --   ALBUMIN 2.2* 2.0* 2.1*   Cardiac Enzymes  Recent Labs Lab 12/29/14 1709 12/29/14 2211 12/30/14 0456  TROPONINI 0.32* 0.29* 0.30*   Glucose  Recent Labs Lab 01/02/15 1215 01/02/15 1600 01/02/15 1932 01/03/15 0028 01/03/15 0326 01/03/15 0809  GLUCAP 150* 120* 119* 117* 128* 100*    Imaging Dg Chest Port 1 View  01/03/2015  CLINICAL DATA:  Acute respiratory failure, ventilated patient, anoxic brain injury, history of COPD and CHF EXAM: PORTABLE CHEST 1 VIEW COMPARISON:  Portable chest x-ray of January 02, 2015 FINDINGS: The lungs are well-expanded. Density in the left hemithorax has increased. The right lung is grossly clear. The cardiac silhouette is top-normal in size. The pulmonary vascularity is slightly more conspicuous today. There is no pneumothorax. The bony thorax exhibits no acute abnormality. There is are 2 stable 3-5 mm calcification in the right upper lobe. IMPRESSION: Worsening of atelectasis or pneumonia in the left lower lobe. This may reflect aspiration. A small left pleural effusion may be present. Persistent right infrahilar subsegmental atelectasis. Borderline cardiomegaly without significant pulmonary edema. Electronically Signed   By: David  Martinique M.D.   On: 01/03/2015 07:45    ASSESSMENT / PLAN:  NEUROLOGIC A:   Acute metabolic encephalopathy - due to sedation and acute CVA. New infarcts involving the inferior division right MCA and left PCA / ICA territory at the occipital lobe with associated cytotoxic edema - s/p neuro IR revascularization 10/26. H/O anxiety  P:   Further workup & MRI per Neurology Sedation:  off ASA '325mg'$  daily Lipitor '80mg'$  qhs RASS goal: 0  S/P Integrilin  PULMONARY OETT 10/26 >>> 10/30 A: Acute Respiratory Failure - due to inability to protect airway in the setting of acute CVA. CAP/Aspiration  Pneumonia Mucus Plugging  Apparent NSCC of lung (stage 3) s/p LLL lobectomy - pt was supposed to have post op chemo / XRT but he apparently declined. H/O COPD by report - no PFT's in system  P:   Extubated but with marginal airway protection. He is more awake. At this point I don;t believe more time ventilated will enhance his chances for overall success. I will speak with his wife, recommend that he should not be reintubated. She would not want tracheostomy or prolonged airway protection, which is probably what we are committing him to if he gets reintubated.  Follow CXR Continuing Chest PT Continuing Mucomyst & Albuterol nebs q6hr for airway clearance  CARDIOVASCULAR A:  Troponin I Elevation - likely demand ischemia due to GI bleeding. Multiple wall motion abnormalities on TTE. Biventricular dysfunction. Tight BP control - per neuro IR, goal BP 120 - 140. H/O HTN & CHF (Echo from 10/23 at [facility]  with EF 30-35%) H/O Atrial Fibrillation H/O CAD & MI s/p PCI (on plavix and xarelto)  P:  BP parameters per Neuro & IR Continuing to hold Xarleto ASA '325mg'$  daily Lipitor '80mg'$  qhs Coreg on hold Labetalol IV prn Monitoring patient on Telemetry, remove art line  RENAL A:   Hypocalcemia - Mild. S/P Calcium gluconate. Hypokalemia P:   NS @ 50cc/hr Monitoring UOP with foley catheter Monitoring renal function with daily BUN/Creatinine Replete lytes   GASTROINTESTINAL A:   Acute blood loss anemia due to GI bleed - s/p 4 PRBC transfusions at [place]. H/O GERD  P:   Monitor for further signs of GI bleeding. Follow Hgb & transfusion guidelines as outlined below. Protonix to IV q12hr Holding on GI consult pending improvement in mental status Swallow eval > suspect he will need to be NPO  HEMATOLOGIC  Recent Labs  01/02/15 0459 01/03/15 0515  HGB 8.3* 8.3*   A:   Anemia - Presumed lower GI bleeding - s/p 4 PRBC transfusions at [place]. No active bleeding seen. Leukocytosis  - Improving. Reactive versus Sepsis given recent fever & intervention VTE Prophylaxis P:  Trending Hgb q24hr Transfuse for Hgb < 7. SCD's. Lovenox [place] q24hr  INFECTIOUS A:   CAP/Aspiration Pneumonia  P:   Rocephin 2gm IV q24hr 10/28>> Procalcitonin algorithm Trending leukocytosis  Respiratory Ctx 10/27>>>neg Blood Ctx 10/27>>> Urine Ctx 10/27 - negative   ENDOCRINE A:   Hyperglycemia  CBG (last 3)   Recent Labs  01/03/15 0028 01/03/15 0326 01/03/15 0809  GLUCAP 117* 128* 100*   P:   Accuchecks q4hr  Low dose SSI coverage   Family updated:  Wife updated at bedside 10/30   Interdisciplinary Family Meeting v Palliative Care Meeting:  Due by: 11/1.   Baltazar Apo, MD, PhD 01/03/2015, 9:32 AM [place] [phone] or if no answer [phone]

## 2015-01-03 NOTE — Evaluation (Signed)
Clinical/Bedside Swallow Evaluation Patient Details  Name: Joseph Reed MRN: [id number] Date of Birth: June 04, 1945  Today's Date: 01/03/2015 Time: SLP Start Time (ACUTE ONLY): 1457 SLP Stop Time (ACUTE ONLY): 1527 SLP Time Calculation (min) (ACUTE ONLY): 30 min  Past Medical History:  Past Medical History  Diagnosis Date  . Coronary artery disease   . Hypertension   . CHF (congestive heart failure) ([place])   . MI, old     35 years ago  . Hx of cardiac cath   . Valvular heart disease   . COPD (chronic obstructive pulmonary disease) ([place])   . Emphysema/COPD ([place])   . UTI (lower urinary tract infection)   . GERD (gastroesophageal reflux disease)   . Arthritis   . Cancer ([place])     lung- squamous cell carcinoma- IIIA  . Anemia   . Stroke ([place])     L sided weakness  . Anxiety    Past Surgical History:  Past Surgical History  Procedure Laterality Date  . Tonsillectomy    . Angioplasty      stent april 2016  . Lung lobectomy    . Radiology with anesthesia N/A 12/29/2014    Procedure: RADIOLOGY WITH ANESTHESIA;  Surgeon: Luanne Bras, MD;  Location: [facility];  Service: Radiology;  Laterality: N/A;   HPI:  pt presents with R MCA, L PCA Infarcts with L ICA Occlusion s/p Revascularization. pt with hx of recent CVA and GIB causing pt to have to stop anticoagulants. pt with hx of MI, HTN, A-Fib, and Lung CA.    Assessment / Plan / Recommendation Clinical Impression  Pt has a high risk of aspiration due to inconsistent swallow initiation with likely severe delay. With Max cues, pt makes minimal attempts to push thin liquid from melting ice chips posteriorly into his pharynx, with wet vocal quality and delayed coughing likely from premature spillage into the airway. Thermotactile stimulation did not appear to facilitate pharyngeal trigger. Suspext that this is also occuring with his secretions. Discussed results and recommendations with his wife, who has many questions about  current level of function and plan of care. SLP answered as many questions as able, and deffered to additional providers as needed. Will continue to follow.    Aspiration Risk  Severe    Diet Recommendation Alternative means - temporary;NPO   Medication Administration: Via alternative means    Other  Recommendations Oral Care Recommendations: Oral care QID   Follow Up Recommendations   SNF    Frequency and Duration min 2x/week  2 weeks   Pertinent Vitals/Pain n/a    SLP Swallow Goals     Swallow Study Prior Functional Status       General Other Pertinent Information: pt presents with R MCA, L PCA Infarcts with L ICA Occlusion s/p Revascularization. pt with hx of recent CVA and GIB causing pt to have to stop anticoagulants. pt with hx of MI, HTN, A-Fib, and Lung CA.  Type of Study: Bedside swallow evaluation Previous Swallow Assessment: none in chart Diet Prior to this Study: NPO Temperature Spikes Noted: Yes (99.9) Respiratory Status: Supplemental O2 delivered via (comment) ([place]) History of Recent Intubation: Yes Length of Intubations (days): 5 days Date extubated: 01/02/15 Behavior/Cognition: Alert;Doesn't follow directions Oral Cavity - Dentition: Missing dentition Self-Feeding Abilities: Total assist Patient Positioning: Upright in bed Baseline Vocal Quality: Not observed Volitional Cough: Cognitively unable to elicit Volitional Swallow: Unable to elicit    Oral/Motor/Sensory Function Overall Oral Motor/Sensory Function:  (difficult to assess given  mentation)   Ice Chips Ice chips: Impaired Presentation: Spoon Oral Phase Impairments: Poor awareness of bolus;Impaired anterior to posterior transit Pharyngeal Phase Impairments: Suspected delayed Swallow;Wet Vocal Quality;Cough - Delayed   Thin Liquid Thin Liquid: Not tested    Nectar Thick Nectar Thick Liquid: Not tested   Honey Thick Honey Thick Liquid: Not tested   Puree Puree: Not tested   Solid     Solid: Not tested      Germain Osgood, M.A. CCC-SLP [phone]  Germain Osgood 01/03/2015,5:08 PM

## 2015-01-03 NOTE — Evaluation (Signed)
Occupational Therapy Evaluation Patient Details Name: Joseph Reed MRN: [id number] DOB: 30-Jul-1945 Today's Date: 01/03/2015    History of Present Illness pt presents with R MCA, L PCA Infarcts with L ICA Occlusion s/p Revascularization.  pt with hx of recent CVA and GIB causing pt to have to stop anticoagulants.  pt with hx of MI, HTN, A-Fib,  and Lung CA.     Clinical Impression   No family available to gather information about PLOF.  Pt presents with dense L hemiplegia with apparent impaired sensation, R gaze preference with tendency to keep R eye closed and lethargy. He does not follow commands and is non verbal with difficulty managing secretions.  Pt requires +2 total assist for bed level and EOB activity and demonstrates poor sitting balance.  Will follow acutely.  Recommending SNF at discharge.    Follow Up Recommendations  SNF;Supervision/Assistance - 24 hour    Equipment Recommendations       Recommendations for Other Services       Precautions / Restrictions Precautions Precautions: Fall Restrictions Weight Bearing Restrictions: No      Mobility Bed Mobility Overal bed mobility: Needs Assistance;+2 for physical assistance Bed Mobility: Supine to Sit;Sit to Supine     Supine to sit: Total assist;+2 for physical assistance;HOB elevated Sit to supine: Total assist;+2 for physical assistance   General bed mobility comments: pt not participating in bed mobility.    Transfers                      Balance Overall balance assessment: Needs assistance Sitting-balance support: Single extremity supported;Feet supported Sitting balance-Leahy Scale: Poor Sitting balance - Comments: pt remains in flexed position and does not attempt to right self when experiencing a LOB.  When in midline pt does seem to participate in maintaining balance.                                      ADL                                          General ADL Comments: Total assist     Vision Vision Assessment?: Yes Eye Alignment:  (variable) Ocular Range of Motion:  (can maintain eyes at neutral, looks R only) Alignment/Gaze Preference: Gaze right Tracking/Visual Pursuits: Left eye does not track laterally (unable to assess R eye due to pt keeping it closed ) Visual Fields: Left visual field deficit Additional Comments: Pt keeps R eye closed predominantly. Does not repond to threat with either eye.   Perception     Praxis      Pertinent Vitals/Pain Pain Assessment: Faces Faces Pain Scale: No hurt     Hand Dominance     Extremity/Trunk Assessment Upper Extremity Assessment Upper Extremity Assessment: LUE deficits/detail;RUE deficits/detail RUE Deficits / Details: generalized weakness, performed hand to face with max assist to initiate RUE Coordination: decreased fine motor;decreased gross motor LUE Deficits / Details: 0/5 with flaccidity, did not respond to pain LUE Coordination: decreased fine motor;decreased gross motor   Lower Extremity Assessment Lower Extremity Assessment: Defer to PT evaluation RLE Deficits / Details: Appears WFL, though pt not following directions to assess.  Pain sensation intact.   LLE Deficits / Details: pt with no active movement noted.  pt does withdrawl to  painful stimuli.     Cervical / Trunk Assessment Cervical / Trunk Assessment: Kyphotic   Communication Communication Communication: Expressive difficulties   Cognition Arousal/Alertness: Lethargic Behavior During Therapy: Flat affect Overall Cognitive Status: Difficult to assess                 General Comments: pt with impaired communication, so difficult to assess, but does purposefully use wash cloth when it is put in his hand.  pt also will attempt to move away from suction and when PT attempting to open pt's R eye.     General Comments       Exercises       Shoulder Instructions      Home Living  Family/patient expects to be discharged to:: Skilled nursing facility Living Arrangements: Spouse/significant other                                      Prior Functioning/Environment          Comments: Unclear level of A needed as pt unable to state and no family present.      OT Diagnosis: Generalized weakness;Cognitive deficits;Disturbance of vision (hemiplegia)   OT Problem List: Decreased strength;Decreased range of motion;Decreased activity tolerance;Impaired balance (sitting and/or standing);Impaired vision/perception;Decreased coordination;Decreased cognition;Decreased safety awareness;Impaired sensation;Impaired tone;Impaired UE functional use   OT Treatment/Interventions: Self-care/ADL training;Neuromuscular education;Therapeutic exercise;Therapeutic activities;Cognitive remediation/compensation;Visual/perceptual remediation/compensation;Patient/family education;Balance training    OT Goals(Current goals can be found in the care plan section) Acute Rehab OT Goals Patient Stated Goal: pt unable to state. OT Goal Formulation: Patient unable to participate in goal setting Time For Goal Achievement: 01/17/15 Potential to Achieve Goals: Fair ADL Goals Pt Will Perform Grooming: with max assist;sitting;bed level (wash face, wipe mouth, oral care with sponge) Pt/caregiver will Perform Home Exercise Program: Increased ROM;Right Upper extremity;With Supervision (caregiver will be position and perform PROM) Additional ADL Goal #1: Pt will follow one step commands with multimodal cues 25% of time. Additional ADL Goal #2: Pt will sit EOB x 2 minutes with close guard assist. Additional ADL Goal #3: Pt will turn his head beyond midline toward the L to locate therapist/family 25% of time.  OT Frequency: Min 2X/week   Barriers to D/C:            Co-evaluation   Reason for Co-Treatment: Complexity of the patient's impairments (multi-system involvement) PT goals addressed  during session: Mobility/safety with mobility;Balance        End of Session Nurse Communication: Mobility status  Activity Tolerance: Patient limited by lethargy Patient left: in bed;with call bell/phone within reach   Time: 0920-0949 OT Time Calculation (min): 29 min Charges:  OT General Charges $OT Visit: 1 Procedure OT Evaluation $Initial OT Evaluation Tier I: 1 Procedure G-Codes:    [name] 01/03/2015, 11:03 AM  [phone]

## 2015-01-03 NOTE — Evaluation (Signed)
Physical Therapy Evaluation Patient Details Name: Joseph Reed MRN: [id number] DOB: October 14, 1945 Today's Date: 01/03/2015   History of Present Illness  pt presents with R MCA, L PCA Infarcts with L ICA Occlusion s/p Revascularization.  pt with hx of recent CVA and GIB causing pt to have to stop anticoagulants.  pt with hx of MI, HTN, A-Fib,  and Lung CA.    Clinical Impression  Pt lethargic and with minimal participation in mobility.  Pt coughing on secretions throughout session, but having difficulties clearing.  At this time feel pt will need SNF level of care at D/C.  Will continue to follow.      Follow Up Recommendations SNF    Equipment Recommendations  None recommended by PT    Recommendations for Other Services       Precautions / Restrictions Precautions Precautions: Fall Restrictions Weight Bearing Restrictions: No      Mobility  Bed Mobility Overal bed mobility: Needs Assistance;+2 for physical assistance Bed Mobility: Supine to Sit;Sit to Supine     Supine to sit: Total assist;+2 for physical assistance;HOB elevated Sit to supine: Total assist;+2 for physical assistance   General bed mobility comments: pt not participating in bed mobility.    Transfers                    Ambulation/Gait                Stairs            Wheelchair Mobility    Modified Rankin (Stroke Patients Only) Modified Rankin (Stroke Patients Only) Pre-Morbid Rankin Score:  (Unclear pt's PLOF) Modified Rankin: Severe disability     Balance Overall balance assessment: Needs assistance Sitting-balance support: Single extremity supported;Feet supported Sitting balance-Leahy Scale: Poor Sitting balance - Comments: pt remains in flexed position and does not attempt to right self when experiencing a LOB.  When in midline pt does seem to participate in maintaining balance.                                       Pertinent Vitals/Pain Pain  Assessment: Faces Faces Pain Scale: No hurt    Home Living Family/patient expects to be discharged to:: Skilled nursing facility                      Prior Function           Comments: Unclear level of A needed as pt unable to state and no family present.       Hand Dominance        Extremity/Trunk Assessment   Upper Extremity Assessment: Defer to OT evaluation           Lower Extremity Assessment: RLE deficits/detail;LLE deficits/detail RLE Deficits / Details: Appears WFL, though pt not following directions to assess.  Pain sensation intact.   LLE Deficits / Details: pt with no active movement noted.  pt does withdrawl to painful stimuli.    Cervical / Trunk Assessment: Kyphotic  Communication   Communication: Expressive difficulties  Cognition Arousal/Alertness: Lethargic Behavior During Therapy: Flat affect Overall Cognitive Status: Difficult to assess                 General Comments: pt with impaired communication, so difficult to assess, but does purposefully use wash cloth when it is put in his hand.  pt also will attempt  to move away from suction and when PT attempting to open pt's R eye.      General Comments      Exercises        Assessment/Plan    PT Assessment Patient needs continued PT services  PT Diagnosis Generalized weakness   PT Problem List Decreased strength;Decreased activity tolerance;Decreased balance;Decreased mobility;Decreased coordination;Decreased cognition;Decreased knowledge of use of DME;Decreased safety awareness;Impaired sensation;Cardiopulmonary status limiting activity  PT Treatment Interventions DME instruction;Functional mobility training;Therapeutic activities;Therapeutic exercise;Balance training;Neuromuscular re-education;Cognitive remediation;Patient/family education   PT Goals (Current goals can be found in the Care Plan section) Acute Rehab PT Goals Patient Stated Goal: pt unable to state. PT Goal  Formulation: Patient unable to participate in goal setting Time For Goal Achievement: 01/17/15 Potential to Achieve Goals: Fair    Frequency Min 3X/week   Barriers to discharge        Co-evaluation PT/OT/SLP Co-Evaluation/Treatment: Yes Reason for Co-Treatment: Complexity of the patient's impairments (multi-system involvement) PT goals addressed during session: Mobility/safety with mobility;Balance         End of Session Equipment Utilized During Treatment: Oxygen Activity Tolerance: Patient limited by lethargy Patient left: in bed;with call bell/phone within reach;with bed alarm set Nurse Communication: Mobility status         Time: 4010-2725 PT Time Calculation (min) (ACUTE ONLY): 29 min   Charges:   PT Evaluation $Initial PT Evaluation Tier I: 1 Procedure     PT G CodesCatarina Hartshorn, Goodman 01/03/2015, 10:47 AM

## 2015-01-04 DIAGNOSIS — I63031 Cerebral infarction due to thrombosis of right carotid artery: Secondary | ICD-10-CM

## 2015-01-04 DIAGNOSIS — R531 Weakness: Secondary | ICD-10-CM | POA: Insufficient documentation

## 2015-01-04 LAB — GLUCOSE, CAPILLARY
GLUCOSE-CAPILLARY: 100 mg/dL — AB (ref 65–99)
GLUCOSE-CAPILLARY: 102 mg/dL — AB (ref 65–99)
GLUCOSE-CAPILLARY: 114 mg/dL — AB (ref 65–99)
GLUCOSE-CAPILLARY: 113 mg/dL — AB (ref 65–99)
Glucose-Capillary: 97 mg/dL (ref 65–99)
Glucose-Capillary: 116 mg/dL — ABNORMAL HIGH (ref 65–99)
Glucose-Capillary: 111 mg/dL — ABNORMAL HIGH (ref 65–99)

## 2015-01-04 LAB — BASIC METABOLIC PANEL
ANION GAP: 7 (ref 5–15)
BUN: 16 mg/dL (ref 6–20)
CALCIUM: 8.2 mg/dL — AB (ref 8.9–10.3)
CO2: 23 mmol/L (ref 22–32)
Chloride: 108 mmol/L (ref 101–111)
Creatinine, Ser: 0.94 mg/dL (ref 0.61–1.24)
GFR calc non Af Amer: >60 mL/min (ref >60)
Glucose, Bld: 105 mg/dL — ABNORMAL HIGH (ref 65–99)
POTASSIUM: 3.7 mmol/L (ref 3.5–5.1)
Sodium: 138 mmol/L (ref 135–145)

## 2015-01-04 LAB — CULTURE, BLOOD (ROUTINE X 2)
CULTURE: NO GROWTH
Culture: NO GROWTH

## 2015-01-04 LAB — CALCIUM, IONIZED: Calcium, Ionized, Serum: 5 mg/dL (ref 4.5–5.6)

## 2015-01-04 MED ORDER — DILTIAZEM HCL 25 MG/5ML IV SOLN
5.0000 mg | Freq: Once | INTRAVENOUS | Status: AC
  Administered 2015-01-04: 5 mg via INTRAVENOUS
  Filled 2015-01-04: qty 5

## 2015-01-04 NOTE — Progress Notes (Addendum)
STROKE TEAM PROGRESS NOTE  HISTORY Joseph Reed is a 69 y.o. male with multiple medical problems and a recent stroke 3 weeks ago. Patient was on anticoagulation and was admitted to [facility] with a GI bleed. Patient had to have all anticoagulation and antiplatelet therapy discontinued. On the day prior to admission he was noted to have some right sided weakness. The morning of admission he was found flaccid on the right and unable to communicate. Patient transferred here for possible interventional procedure.   Date last known well: Date: 12/28/2014 Time last known well: Time: 22:00 tPA Given: No: Outside time window, recent infarct, GI bleed   SUBJECTIVE (INTERVAL HISTORY) No events overnight.     Patient remains extubated and respiratory status improved today.. Critical care  has talk edto family who agreed to DO NOT RESUSCITATE  OBJECTIVE Temp:  [98 F (36.7 C)-99 F (37.2 C)] 98.6 F (37 C) (11/01 1100) Pulse Rate:  [59-136] 111 (11/01 1200) Cardiac Rhythm:  [-] Atrial fibrillation (11/01 0756) Resp:  [19-29] 25 (11/01 1200) BP: (101-165)/(62-94) 111/84 mmHg (11/01 1200) SpO2:  [95 %-100 %] 99 % (11/01 1200) Weight:  [188 lb 4.4 oz (85.4 kg)] 188 lb 4.4 oz (85.4 kg) (11/01 0400)  CBC:   Recent Labs Lab 01/02/15 0459 01/03/15 0515  WBC 9.4 8.6  NEUTROABS 6.5 6.2  HGB 8.3* 8.3*  HCT 27.5* 26.6*  MCV 91.4 90.2  PLT 288 681    Basic Metabolic Panel:   Recent Labs Lab 01/02/15 0459 01/03/15 0515 01/04/15 0322  NA 133* 139 138  K 3.6 3.6 3.7  CL 106 110 108  CO2 21* 21* 23  GLUCOSE 132* 107* 105*  BUN '13 15 16  '$ CREATININE 0.87 0.90 0.94  CALCIUM 7.9* 8.2* 8.2*  MG 2.0 1.9  --   PHOS 2.6 2.4*  --     Lipid Panel:     Component Value Date/Time   CHOL 143 12/30/2014 0453   TRIG 81 01/02/2015 0459   HDL 23* 12/30/2014 0453   CHOLHDL 6.2 12/30/2014 0453   VLDL 23 12/30/2014 0453   LDLCALC 97 12/30/2014 0453   HgbA1c:  Lab Results  Component Value  Date   HGBA1C 5.8* 12/30/2014   Urine Drug Screen: No results found for: LABOPIA, COCAINSCRNUR, LABBENZ, AMPHETMU, THCU, LABBARB    IMAGING I have personally reviewed the radiological images below and agree with the radiology interpretations.  Ct Head Wo Contrast 12/29/2014   No evidence for complication related endovascular treatment, such as subarachnoid or parenchymal hemorrhage. No evidence for cortical ischemia within the LEFT MCA or LEFT ACA territory. Acute RIGHT MCA and LEFT PCA infarcts appear unchanged as previously outlined on earlier perfusion scan.   Ct Angio Head & Neck W/cm &/or Wo/cm 12/29/2014   1. Acute appearing occlusion of the left ICA from the cervical portion to the ophthalmic segment. 2. Right M1 -M2 branch occlusion of the inferior division with completed infarct. 3. High-grade stenosis of the P2 and P3 segments with completed medial occipital lobe infarct. 4. Diffuse moderate right ICA stenosis with appearance of dissection, favored chronic given low-density mural appearance. 5. High-grade stenosis of the bilateral V1 segments. 6. Extensive intracranial atherosclerosis with multi focal moderate stenosis.   Ct Cerebral Perfusion W/cm 12/29/2014  1. Completed infarcts involving the inferior division right MCA territory and in the left PCA territory at the occipital lobe. Associated cytotoxic edema is newly seen since yesterday's CT. 2. Extensive left cerebral ischemia/penumbra in the setting of left  ICA occlusion.   Dg Chest Port 1 View 12/30/2014  Improved aeration of the left lung. Persistent dense consolidation or atelectasis in the left lower lobe and likely small left pleural effusion.  12/29/2014   1. New significant opacity in the left lung consisting of left pleural effusion and atelectasis or infiltrate. 2. Postoperative changes in the left lung. 3. Placement of endotracheal tube and nasogastric tube.  12/31/2014  IMPRESSION: NG tube with tip in proximal stomach  just below the GE junction. Endotracheal tube with tip 7.8 cm above the carina. No pneumothorax. Small left pleural effusion with left basilar atelectasis or infiltrate.  R & L common carotidarteriogram LT ICA and LT MCA occlusion with recannulization TICI 3  2D echo - - The patient was in atrial fibrillation. Moderate LV hypertrophy. EF 30% with wall motion abnormalities as noted above. Aortic sclerosis without significant stenosis. Mildly dilated RV with mildly decreased systolic function. Moderate biatrial enlargement.  MRI and MRA brain 12/30/2014  IMPRESSION: 1. Oral enteric tubing is looped in the pharynx. Recommend repositioning. 2. Moderate to large right MCA infarct, sparing the anterior division. Associated occlusion of the right MCA M2 posterior division. 3. Moderate to large left PCA infarct. Associated occlusion of the left PCA P3 superior division. 4. Improved patency of the left ICA siphon since the CTA yesterday. Moderate to severe bilateral ICA irregularity and stenosis. 5. Trace petechial hemorrhage in the left PCA territory. No significant intracranial mass effect at this time and no malignant hemorrhagic transformation.     PHYSICAL EXAM  Temp:  [98 F (36.7 C)-99 F (37.2 C)] 98.6 F (37 C) (11/01 1100) Pulse Rate:  [59-136] 111 (11/01 1200) Resp:  [19-29] 25 (11/01 1200) BP: (101-165)/(62-94) 111/84 mmHg (11/01 1200) SpO2:  [95 %-100 %] 99 % (11/01 1200) Weight:  [188 lb 4.4 oz (85.4 kg)] 188 lb 4.4 oz (85.4 kg) (11/01 0400)  General - Well nourished, well developed,  Elderly male  HEENT-  NCAT, pupils reactive, sclera clear, intubated Cardiovascular - irregularly irregular heart rate and rhythm Pulmonary:  CTA GI- ND, normal bowel sounds GU- deferred Extrem- No C/C/E  NEURO EXAM MENTAL STATUS:  , eyes open without stimulation, not following commands  . Mute aphasic.  CRANIAL NERVES: PERRL; blinks to visual threat  Right gaze preference but will  cross midline with OCRs Absent corneal on the right; sluggish on the left Positive gag  MOTOR/SENSORY EXAM: Spontaneously moving right UE and LE, and moves against gravity; with noxious stimuli trace withdraw LUE and 2/5 LLE.   GAIT:   Deferred   ASSESSMENT/PLAN Joseph Reed is a 69 y.o. male with history of MI s/p stenting in 06/2014, HTN, smoker, lung  Cancer, recent stroke found to have afib on Xarelto and plavix but pt stopped Xarelto two weeks ago, recent LGIB admitted to [facility] for blood transfusion, transferred to [facility] from [place] for aphasia and right LE weakness.  S/p complete revascularization of LT ICA occlusion.    Stroke:  R MCA, L PCA infarcts, showing on CTP. S/P L ICA TICI3 revascularization. Stroke etiology likely due to afib not on anticoagulation due to GIB. However, pt does have significant large vessel athero, which could be etiology for stroke too. However, his initial right side weakness with aphasia did not fit to the stroke identified by CT, needs MRI for further evaluation.  Resultant  Intubated with left sided weakness  MRI confirmed left PCA and right MCA infarcts  MRA left ICA remains open  but b/l ICA athero, right M2 and left P2 occlusion  CTA head and neck and CTP - left ICA occlusion, right MCA infarct and left PCA infarct but left MCA and ACA are open  Angio - left ICA occlusion with TICI3 recannulization  2D Echo EF 30%   LDL 97  HgbA1c 5.8  lovenox for VTE prophylaxis Diet NPO time specified  clopidogrel 75 mg daily and Xarelto (rivaroxaban) daily prior to admission for atrial fibrillation, now on ASA '325mg'$ .  Ongoing aggressive stroke risk factor management  Therapy recommendations:  pending   Disposition:  pending   History of stroke  06/2014 - after cardiac cath, MRI showed two punctate infarct - brainstem and right parietal WM  12/07/14 - small right ACA and punctate right peri-ventricle   Intracranial  atherosclerosis  Left ICA occlusion s/p recannulization  Right ICA diffuse stenosis  Right M2 occlusion  Left P2 occlusion  Bilateral VA stenosis  Diffuse intracranial athero with multi focal moderate stenosis  Atrial Fibrillation  Home anticoagulation:  xarelto and plavix  Pt stopped Xarelto by himself prior to GIB  Hold off AC due to large stroke and recent GIB requiring blood transfusion.   HR 80's - 100's now  Monitoring HR, may consider Cardizem if RVR  Lung cancer  Squamous cell lung cancer s/p LLL lobectomy 09/2013 - pt was supposed to have post op chemo / XRT but he apparently declined.  With adjacent lymph node involvement  Staging not done yet  Left lung atelectasis  Overnight CXR comfirmed  On chest PT, suctioning, albuterol and mucomyst  Repeat CXR improved aeration   CCM on board  LGIB  Hgb 5.8 at [place], S/p 4u PRBCs at [facility]  Suspected variceal bleeding  Taking motrin bid for arthritis pain prior to admission to [facility] 9.7-> 8.4  currently on ASA for stroke   Hb and GIB Q12   Respiratory Failure  Intubated for neuro intervention  CXR with near complete opacity with Mucous plug  Can not extubate today  CAD with MI s/p stent  On plavix prior to [place]  Had MI in 06/2014 s/p PCI and stent  Tobacco abuse  Current smoker  Hypertension  Stable on the low side Permissive hypertension (OK if < 220/120) but gradually normalize in 5-7 days  Hyperlipidemia  Home meds:  unclear  LDL 97, goal < 70  Put on lipitor 80  continue statin at discharge  Other Stroke Risk Factors  Advanced age  hx ETOH abuse  Hx CHF  Other Active Problems  Hx COPD, emphysema  Troponin elevation, felt to be demand ischemia due to stroke  Hypocalcemia  Febrile, TMax 101.3  The patient's prognosis remains poor and I agree with not intubating the patient and DNR. Plan to meet with the family to discuss goals of care and  palliative care. I called the patient's wife and left a message on answering machine to call me back. Discussed with Dr. Lamonte Sakai who is in agreement    SIGNED BY:   Antony Contras, MD  To contact Stroke Continuity provider, please refer to [email or web address] After hours, contact General Neurology

## 2015-01-04 NOTE — Progress Notes (Signed)
Speech Language Pathology Treatment: Dysphagia  Patient Details Name: Joseph Reed MRN: [id number] DOB: 05-27-1945 Today's Date: 01/04/2015 Time: 7076-1518 SLP Time Calculation (min) (ACUTE ONLY): 16 min  Assessment / Plan / Recommendation Clinical Impression  Pt shows small improvements in swallow function from previous date, with more consistent swallow initiation, but still with significant delay. SLP provided Max A for oral acceptance, with minimal active lingual manipulation of ice chip with suspected premature spillage into the airway. Delayed, wet coughing noted. Would continue NPO with SLP f/u.   HPI Other Pertinent Information: pt presents with R MCA, L PCA Infarcts with L ICA Occlusion s/p Revascularization. pt with hx of recent CVA and GIB causing pt to have to stop anticoagulants. pt with hx of MI, HTN, A-Fib, and Lung CA.    Pertinent Vitals Pain Assessment: Faces Faces Pain Scale: No hurt  SLP Plan  Continue with current plan of care    Recommendations Diet recommendations: NPO Medication Administration: Via alternative means       Oral Care Recommendations: Oral care QID Follow up Recommendations: Skilled Nursing facility Plan: Continue with current plan of care    Germain Osgood, M.A. CCC-SLP [phone]  Germain Osgood 01/04/2015, 1:46 PM

## 2015-01-04 NOTE — Evaluation (Signed)
Speech Language Pathology Evaluation Patient Details Name: Joseph Reed MRN: [id number] DOB: [birth year] Today's Date: 01/04/2015 Time: 5397-6734 SLP Time Calculation (min) (ACUTE ONLY): 16 min  Problem List:  Patient Active Problem List   Diagnosis Date Noted  . Non-small cell lung cancer (NSCLC) ([place]) 01/02/2015  . Ventilator dependence ([place])   . Pneumonia   . Mucus plugging of bronchi   . Hyperglycemia   . Respiratory failure ([place])   . Carotid artery stenosis   . Intracranial atherosclerosis   . Gastrointestinal hemorrhage associated with intestinal diverticulitis   . Cerebral infarct ([place]) 12/29/2014  . Cerebrovascular accident (CVA) due to thrombosis of left carotid artery ([place])   . Acute respiratory failure with hypoxia ([place])   . Anoxic brain injury [facility])    Past Medical History:  Past Medical History  Diagnosis Date  . Coronary artery disease   . Hypertension   . CHF (congestive heart failure) ([employer])   . MI, old     20 years ago  . Hx of cardiac cath   . Valvular heart disease   . COPD (chronic obstructive pulmonary disease) ([place])   . Emphysema/COPD ([place])   . UTI (lower urinary tract infection)   . GERD (gastroesophageal reflux disease)   . Arthritis   . Cancer ([place])     lung- squamous cell carcinoma- IIIA  . Anemia   . Stroke ([place])     L sided weakness  . Anxiety    Past Surgical History:  Past Surgical History  Procedure Laterality Date  . Tonsillectomy    . Angioplasty      stent april 2016  . Lung lobectomy    . Radiology with anesthesia N/A 12/29/2014    Procedure: RADIOLOGY WITH ANESTHESIA;  Reed: Luanne Bras, MD;  Location: [place];  Service: Radiology;  Laterality: N/A;   HPI:  pt presents with R MCA, L PCA Infarcts with L ICA Occlusion s/p Revascularization. pt with hx of recent CVA and GIB causing pt to have to stop anticoagulants. pt with hx of MI, HTN, A-Fib, and Lung CA.    Assessment / Plan / Recommendation Clinical  Impression  Pt has severe cognitive-linguistic deficits, with few attempts at verbal communication noted today, although speech generated is unintelligible. Brief phonation was acheived. Pt followed one-step commands x2 (stick out your tongue, wipe your mouth), but does not respond to basic yes/no questions. Pt requires SLP f/u to maximize functional cognition and communication.    SLP Assessment  Patient needs continued Speech Lanaguage Pathology Services    Follow Up Recommendations  Skilled Nursing facility;24 hour supervision/assistance    Frequency and Duration min 2x/week  2 weeks   Pertinent Vitals/Pain Pain Assessment: Faces Faces Pain Scale: No hurt   SLP Goals  Progression toward goals: Progressing toward goals Patient/Family Stated Goal: wife has many questions about his recovery, wants to know if he'll eat and drink Potential to Achieve Goals (ACUTE ONLY): Fair Potential Considerations (ACUTE ONLY): Severity of impairments  SLP Evaluation Prior Functioning  Cognitive/Linguistic Baseline: Information not available   Cognition  Overall Cognitive Status: Impaired/Different from baseline Arousal/Alertness: Awake/alert Attention: Focused Focused Attention: Impaired Focused Attention Impairment: Verbal basic;Functional basic Awareness: Impaired Awareness Impairment: Emergent impairment    Comprehension  Auditory Comprehension Overall Auditory Comprehension: Impaired Yes/No Questions: Impaired Basic Biographical Questions: 0-25% accurate Commands: Impaired One Step Basic Commands: 0-24% accurate    Expression Verbal Expression Overall Verbal Expression: Impaired Initiation: Impaired   Oral / Motor  Germain Osgood, M.A. CCC-SLP [phone]  Germain Osgood 01/04/2015, 2:04 PM

## 2015-01-05 LAB — GLUCOSE, CAPILLARY
GLUCOSE-CAPILLARY: 139 mg/dL — AB (ref 65–99)
Glucose-Capillary: 123 mg/dL — ABNORMAL HIGH (ref 65–99)
Glucose-Capillary: 146 mg/dL — ABNORMAL HIGH (ref 65–99)

## 2015-01-05 MED ORDER — MORPHINE SULFATE (PF) 2 MG/ML IV SOLN: 1.0000 mg | INTRAVENOUS | Status: DC | PRN

## 2015-01-05 MED ORDER — LORAZEPAM 2 MG/ML IJ SOLN: 1.0000 mg | INTRAMUSCULAR | Status: DC | PRN

## 2015-01-05 NOTE — Progress Notes (Signed)
Speech Language Pathology Treatment: Dysphagia  Patient Details Name: Joseph Reed MRN: [id number] DOB: 19-Mar-1945 Today's Date: 01/05/2015 Time: 0312-8118 SLP Time Calculation (min) (ACUTE ONLY): 9 min  Assessment / Plan / Recommendation Clinical Impression  Treatment focused on dysphagia goals - per RN, pt may be transitioning to comfort care. SLP provided Total A for oral care prior to administering ice chip trials. Pt continues to show small improvements with oral acceptance and initiation of oral movements, although he still has poor bolus awareness. Pharyngeal trigger is occuring more consistently, yet ice chips still result in wet vocal quality and delayed coughing. Would continue NPO with focus on frequent and thorough oral care for comfort.   HPI Other Pertinent Information: pt presents with R MCA, L PCA Infarcts with L ICA Occlusion s/p Revascularization. pt with hx of recent CVA and GIB causing pt to have to stop anticoagulants. pt with hx of MI, HTN, A-Fib, and Lung CA.    Pertinent Vitals Pain Assessment: Faces Faces Pain Scale: No hurt  SLP Plan  Continue with current plan of care    Recommendations Diet recommendations: NPO Medication Administration: Via alternative means       Oral Care Recommendations: Oral care QID Follow up Recommendations: Other (comment) (tba - ?palliative care per RN) Plan: Continue with current plan of care    Germain Osgood, M.A. CCC-SLP [phone]  Germain Osgood 01/05/2015, 9:27 AM

## 2015-01-05 NOTE — Progress Notes (Signed)
Notified Dr Janann Colonel about pt elevated HR and blood pressure. Pt does not appear to be in any distress, resting in bed. Orders received for Cardizem, given per orders. Will continue to monitor

## 2015-01-05 NOTE — Progress Notes (Signed)
PT Cancellation Note  Patient Details Name: Joseph Reed MRN: [id number] DOB: 10/24/1945   Cancelled Treatment:    Reason Eval/Treat Not Completed: Other (comment).  Per RN pt is awaiting consult from Palliative Care to address pt's comfort w/ potential d/c to Hospice.  PT is signing off at this time, please place new order if pt's status changes.  Thank you.  Joslyn Hy PT, DPT [phone] Pager: [phone] 01/05/2015, 12:06 PM

## 2015-01-05 NOTE — Progress Notes (Signed)
STROKE TEAM PROGRESS NOTE  HISTORY Joseph Reed is a 69 y.o. male with multiple medical problems and a recent stroke 3 weeks ago. Patient was on anticoagulation and was admitted to [facility] with a GI bleed. Patient had to have all anticoagulation and antiplatelet therapy discontinued. On the day prior to admission he was noted to have some right sided weakness. The morning of admission he was found flaccid on the right and unable to communicate. Patient transferred here for possible interventional procedure.   Date last known well: Date: 12/28/2014 Time last known well: Time: 22:00 tPA Given: No: Outside time window, recent infarct, GI bleed   SUBJECTIVE (INTERVAL HISTORY) No events overnight.     Patient  is now slightly more arousable but remains mute and aphasic with dense left hemiparesis I spoke to wife yesterday over the phone and today at the bedside who agreed to DO NOT RESUSCITATE and comfort care and would prefer patient being transferred to hospice in [facility] where they live OBJECTIVE Temp:  [98 F (36.7 C)-98.8 F (37.1 C)] 98.6 F (37 C) (11/02 1400) Pulse Rate:  [112-160] 144 (11/02 1400) Cardiac Rhythm:  [-] Atrial fibrillation (11/02 0721) Resp:  [17-27] 24 (11/02 1400) BP: (154-176)/(65-108) 154/91 mmHg (11/02 1400) SpO2:  [98 %-99 %] 99 % (11/02 1400) Weight:  [184 lb 8.4 oz (83.7 kg)] 184 lb 8.4 oz (83.7 kg) (11/02 0400)  CBC:   Recent Labs Lab 01/02/15 0459 01/03/15 0515  WBC 9.4 8.6  NEUTROABS 6.5 6.2  HGB 8.3* 8.3*  HCT 27.5* 26.6*  MCV 91.4 90.2  PLT 288 419    Basic Metabolic Panel:   Recent Labs Lab 01/02/15 0459 01/03/15 0515 01/04/15 0322  NA 133* 139 138  K 3.6 3.6 3.7  CL 106 110 108  CO2 21* 21* 23  GLUCOSE 132* 107* 105*  BUN '13 15 16  '$ CREATININE 0.87 0.90 0.94  CALCIUM 7.9* 8.2* 8.2*  MG 2.0 1.9  --   PHOS 2.6 2.4*  --     Lipid Panel:     Component Value Date/Time   CHOL 143 12/30/2014 0453   TRIG 81  01/02/2015 0459   HDL 23* 12/30/2014 0453   CHOLHDL 6.2 12/30/2014 0453   VLDL 23 12/30/2014 0453   LDLCALC 97 12/30/2014 0453   HgbA1c:  Lab Results  Component Value Date   HGBA1C 5.8* 12/30/2014   Urine Drug Screen: No results found for: LABOPIA, COCAINSCRNUR, LABBENZ, AMPHETMU, THCU, LABBARB    IMAGING I have personally reviewed the radiological images below and agree with the radiology interpretations.  Ct Head Wo Contrast 12/29/2014   No evidence for complication related endovascular treatment, such as subarachnoid or parenchymal hemorrhage. No evidence for cortical ischemia within the LEFT MCA or LEFT ACA territory. Acute RIGHT MCA and LEFT PCA infarcts appear unchanged as previously outlined on earlier perfusion scan.   Ct Angio Head & Neck W/cm &/or Wo/cm 12/29/2014   1. Acute appearing occlusion of the left ICA from the cervical portion to the ophthalmic segment. 2. Right M1 -M2 branch occlusion of the inferior division with completed infarct. 3. High-grade stenosis of the P2 and P3 segments with completed medial occipital lobe infarct. 4. Diffuse moderate right ICA stenosis with appearance of dissection, favored chronic given low-density mural appearance. 5. High-grade stenosis of the bilateral V1 segments. 6. Extensive intracranial atherosclerosis with multi focal moderate stenosis.   Ct Cerebral Perfusion W/cm 12/29/2014  1. Completed infarcts involving the inferior division right MCA  territory and in the left PCA territory at the occipital lobe. Associated cytotoxic edema is newly seen since yesterday's CT. 2. Extensive left cerebral ischemia/penumbra in the setting of left ICA occlusion.   Dg Chest Port 1 View 12/30/2014  Improved aeration of the left lung. Persistent dense consolidation or atelectasis in the left lower lobe and likely small left pleural effusion.  12/29/2014   1. New significant opacity in the left lung consisting of left pleural effusion and atelectasis or  infiltrate. 2. Postoperative changes in the left lung. 3. Placement of endotracheal tube and nasogastric tube.  12/31/2014  IMPRESSION: NG tube with tip in proximal stomach just below the GE junction. Endotracheal tube with tip 7.8 cm above the carina. No pneumothorax. Small left pleural effusion with left basilar atelectasis or infiltrate.  R & L common carotidarteriogram LT ICA and LT MCA occlusion with recannulization TICI 3  2D echo - - The patient was in atrial fibrillation. Moderate LV hypertrophy. EF 30% with wall motion abnormalities as noted above. Aortic sclerosis without significant stenosis. Mildly dilated RV with mildly decreased systolic function. Moderate biatrial enlargement.  MRI and MRA brain 12/30/2014  IMPRESSION: 1. Oral enteric tubing is looped in the pharynx. Recommend repositioning. 2. Moderate to large right MCA infarct, sparing the anterior division. Associated occlusion of the right MCA M2 posterior division. 3. Moderate to large left PCA infarct. Associated occlusion of the left PCA P3 superior division. 4. Improved patency of the left ICA siphon since the CTA yesterday. Moderate to severe bilateral ICA irregularity and stenosis. 5. Trace petechial hemorrhage in the left PCA territory. No significant intracranial mass effect at this time and no malignant hemorrhagic transformation.     PHYSICAL EXAM  Temp:  [98 F (36.7 C)-98.8 F (37.1 C)] 98.6 F (37 C) (11/02 1400) Pulse Rate:  [112-160] 144 (11/02 1400) Resp:  [17-27] 24 (11/02 1400) BP: (154-176)/(65-108) 154/91 mmHg (11/02 1400) SpO2:  [98 %-99 %] 99 % (11/02 1400) Weight:  [184 lb 8.4 oz (83.7 kg)] 184 lb 8.4 oz (83.7 kg) (11/02 0400)  General - Well nourished, well developed,  Elderly male  HEENT-  NCAT, pupils reactive, sclera clear, intubated Cardiovascular - irregularly irregular heart rate and rhythm Pulmonary:  CTA GI- ND, normal bowel sounds GU- deferred Extrem- No C/C/E  NEURO  EXAM MENTAL STATUS:  , eyes open without stimulation, not following commands  . Mute aphasic.  CRANIAL NERVES: PERRL; blinks to visual threat  Right gaze preference but will cross midline with OCRs Absent corneal on the right; sluggish on the left Positive gag  MOTOR/SENSORY EXAM: Spontaneously moving right UE and LE, and moves against gravity; with noxious stimuli trace withdraw LUE and 2/5 LLE.   GAIT:   Deferred   ASSESSMENT/PLAN Mr. Joseph Reed is a 69 y.o. male with history of MI s/p stenting in 06/2014, HTN, smoker, lung  Cancer, recent stroke found to have afib on Xarelto and plavix but pt stopped Xarelto two weeks ago, recent LGIB admitted to [facility] for blood transfusion, transferred to [facility] from [place] for aphasia and right LE weakness.  S/p complete revascularization of LT ICA occlusion.    Stroke:  R MCA, L PCA infarcts, showing on CTP. S/P L ICA TICI3 revascularization. Stroke etiology likely due to afib not on anticoagulation due to GIB. However, pt does have significant large vessel athero, which could be etiology for stroke too. However, his initial right side weakness with aphasia did not fit to the stroke identified  by CT, needs MRI for further evaluation.  Resultant  Intubated with left sided weakness  MRI confirmed left PCA and right MCA infarcts  MRA left ICA remains open but b/l ICA athero, right M2 and left P2 occlusion  CTA head and neck and CTP - left ICA occlusion, right MCA infarct and left PCA infarct but left MCA and ACA are open  Angio - left ICA occlusion with TICI3 recannulization  2D Echo EF 30%   LDL 97  HgbA1c 5.8  lovenox for VTE prophylaxis Diet NPO time specified  clopidogrel 75 mg daily and Xarelto (rivaroxaban) daily prior to admission for atrial fibrillation, now on ASA '325mg'$ .  Ongoing aggressive stroke risk factor management  Therapy recommendations:  pending   Disposition:  pending   History of stroke  06/2014  - after cardiac cath, MRI showed two punctate infarct - brainstem and right parietal WM  12/07/14 - small right ACA and punctate right peri-ventricle   Intracranial atherosclerosis  Left ICA occlusion s/p recannulization  Right ICA diffuse stenosis  Right M2 occlusion  Left P2 occlusion  Bilateral VA stenosis  Diffuse intracranial athero with multi focal moderate stenosis  Atrial Fibrillation  Home anticoagulation:  xarelto and plavix  Pt stopped Xarelto by himself prior to GIB  Hold off AC due to large stroke and recent GIB requiring blood transfusion.   HR 80's - 100's now  Monitoring HR, may consider Cardizem if RVR  Lung cancer  Squamous cell lung cancer s/p LLL lobectomy 09/2013 - pt was supposed to have post op chemo / XRT but he apparently declined.  With adjacent lymph node involvement  Staging not done yet  Left lung atelectasis  Overnight CXR comfirmed  On chest PT, suctioning, albuterol and mucomyst  Repeat CXR improved aeration   CCM on board  LGIB  Hgb 5.8 at [place], S/p 4u PRBCs at [facility]  Suspected variceal bleeding  Taking motrin bid for arthritis pain prior to admission to [facility] 9.7-> 8.4  currently on ASA for stroke   Hb and GIB Q12   Respiratory Failure  Intubated for neuro intervention  CXR with near complete opacity with Mucous plug  Can not extubate today  CAD with MI s/p stent  On plavix prior to [place]  Had MI in 06/2014 s/p PCI and stent  Tobacco abuse  Current smoker  Hypertension  Stable on the low side Permissive hypertension (OK if < 220/120) but gradually normalize in 5-7 days  Hyperlipidemia  Home meds:  unclear  LDL 97, goal < 70  Put on lipitor 80  continue statin at discharge  Other Stroke Risk Factors  Advanced age  hx ETOH abuse  Hx CHF  Other Active Problems  Hx COPD, emphysema  Troponin elevation, felt to be demand ischemia due to  stroke  Hypocalcemia  Febrile, TMax 101.3  The patient's prognosis remains poor and I agree with  Palliative care and DNR.  Transfer to paliiative care service and full comfort care. Social work consult for transfer to palliative care nursing home over the next few days. Discussed with wife and sister-in-law and answered questions  SIGNED BY:   Antony Contras, MD  To contact Stroke Continuity provider, please refer to [email or web address] After hours, contact General Neurology

## 2015-01-05 NOTE — Progress Notes (Signed)
Nutrition Brief Note  Chart reviewed. Pt now transitioning to comfort care.  No further nutrition interventions warranted at this time.  Please re-consult as needed.   Francess Mullen A. Maurianna Benard, RD, LDN, CDE Pager: [phone] After hours Pager: [phone]  

## 2015-01-06 NOTE — Discharge Planning (Signed)
Patient to be discharged to [facility]. Discharge summary sent to [facility].  RN report number: [phone] Transportation: EMS  Lubertha Sayres, [place] [phone]) and Surgical [phone])

## 2015-01-06 NOTE — Clinical Social Work Note (Signed)
Clinical Social Work Assessment  Patient Details  Name: Joseph Reed MRN: [id number] Date of Birth: January 22, 1946  Date of referral:  01/05/15               Reason for consult:  End of Life/Hospice                Permission sought to share information with:  Family Supports Permission granted to share information::     Name::     Joseph Reed,Joseph Reed  Relationship::  wife  Housing/Transportation Living arrangements for the past 2 months:  Single Family Home Source of Information:  Spouse Patient Interpreter Needed:  None Criminal Activity/Legal Involvement Pertinent to Current Situation/Hospitalization:  No - Comment as needed Significant Relationships:  Spouse Lives with:  Spouse Do you feel safe going back to the place where you live?  No Need for family participation in patient care:  Yes (Comment)  Care giving concerns: None    Social Worker assessment / plan:  CSW met pt's wife Joseph Reed at bedside. CSW introduced self and purpose of visit. CSW and Joseph Reed discussed residential hospice. Joseph Reed expressed interested in [facility]. CSW faxed clinical to [facility]. Joseph Reed reported that Baxter Flattery, RN liaison will arrive today at 10am to assess the pt. CSW provided the [place] with contact information for further questions. CSW will continue to follow this pt and assist with discharge as needed.   Employment status:  Retired Forensic scientist:  Medicare PT Recommendations:  [place] / Referral to community resources:   [facility] )  Patient/Family's Response to care:  Joseph Reed reported that the care in which the pt received has been well.   Patient/Family's Understanding of and Emotional Response to Diagnosis, Current Treatment, and Prognosis: Joseph Reed acknowledged pt's prognosis and current plan. Joseph Reed expressed sadness regarding losing her husband.    Emotional Assessment Appearance:   (Unable to Assess) Attitude/Demeanor/Rapport:  Unable to  Assess Affect (typically observed):  Unable to Assess Orientation:  Oriented to Self Alcohol / Substance use:  Not Applicable Psych involvement (Current and /or in the community):  No (Comment)  Discharge Needs  Concerns to be addressed:  Denies Needs/Concerns at this time Readmission within the last 30 days:  No Current discharge risk:  None Barriers to Discharge:  No Barriers Identified   Karalyne Nusser, LCSW 01/06/2015, 8:39 AM

## 2015-01-06 NOTE — Discharge Summary (Signed)
Stroke Discharge Summary  Patient ID: Joseph Reed   MRN: [id number]      DOB: 10-20-1945  Date of Admission: 12/29/2014 Date of Discharge: 01/06/2015  Attending Physician:  Garvin Fila, MD, Stroke MD  Consulting Physician(s):   Jennet Maduro, MD (critical care medicine) Patient's PCP:  No primary care provider on file.  DISCHARGE DIAGNOSIS:  Stroke: R MCA, L PCA infarcts on CTP s/p L ICA TICI3 revascularization. Stroke etiology embolic due to afib or significant large vessel athero Resultant lethargic state, right sided hemiplegia, global aphasia, dysphagia History of stroke  06/2014 - after cardiac cath, MRI showed two punctate infarct - brainstem and right parietal WM  12/07/14 - small right ACA and punctate right peri-ventricle Intracranial atherosclerosis Atrial Fibrillation Left lung atelectasis Mucus plugging of bronchi AcuteRespiratory Failure with Hypoxia CAD with MI s/p stent Tobacco abuse Hypertension Hyperlipidemia hx ETOH abuse Hx CHF Hx COPD, emphysema Troponin elevation, felt to be demand ischemia due to stroke Hypocalcemia Febrile  Severe malnutrition related to chronic illness Hyperglycemia Carotid artery stenosis, L ICA Gastrointestinal hemorrhage associated with intestinal diverticulitis Pneumonia Ventilator dependence ([place]), resolved Non-small cell lung cancer (NSCLC) ([place]), Squamous cell lung cancer (stage 3) with adjacent lymph node involvement  Past Medical History  Diagnosis Date  . Coronary artery disease   . Hypertension   . CHF (congestive heart failure) ([place])   . MI, old     70 years ago  . Hx of cardiac cath   . Valvular heart disease   . COPD (chronic obstructive pulmonary disease) ([place])   . Emphysema/COPD ([place])   . UTI (lower urinary tract infection)   . GERD (gastroesophageal reflux disease)   . Arthritis   . Cancer ([place])     lung- squamous cell carcinoma- IIIA  . Anemia   . Stroke ([place])     L sided weakness  .  Anxiety    Past Surgical History  Procedure Laterality Date  . Tonsillectomy    . Angioplasty      stent april 2016  . Lung lobectomy    . Radiology with anesthesia N/A 12/29/2014    Procedure: RADIOLOGY WITH ANESTHESIA;  Surgeon: Luanne Bras, MD;  Location: [facility];  Service: Radiology;  Laterality: N/A;      Medication List    STOP taking these medications        carvedilol 3.125 MG tablet  Commonly known as:  COREG     clopidogrel 75 MG tablet  Commonly known as:  PLAVIX     nitroGLYCERIN 0.4 MG SL tablet  Commonly known as:  NITROSTAT     ranolazine 500 MG 12 hr tablet  Commonly known as:  RANEXA     rivaroxaban 20 MG Tabs tablet  Commonly known as:  XARELTO        LABORATORY STUDIES CBC    Component Value Date/Time   WBC 8.6 01/03/2015 0515   RBC 2.95* 01/03/2015 0515   HGB 8.3* 01/03/2015 0515   HCT 26.6* 01/03/2015 0515   PLT 309 01/03/2015 0515   MCV 90.2 01/03/2015 0515   MCH 28.1 01/03/2015 0515   MCHC 31.2 01/03/2015 0515   RDW 14.9 01/03/2015 0515   LYMPHSABS 1.4 01/03/2015 0515   MONOABS 0.8 01/03/2015 0515   EOSABS 0.1 01/03/2015 0515   BASOSABS 0.0 01/03/2015 0515   CMP    Component Value Date/Time   NA 138 01/04/2015 0322   K 3.7 01/04/2015 0322   CL 108 01/04/2015  0322   CO2 23 01/04/2015 0322   GLUCOSE 105* 01/04/2015 0322   BUN 16 01/04/2015 0322   CREATININE 0.94 01/04/2015 0322   CALCIUM 8.2* 01/04/2015 0322   PROT 5.6* 01/02/2015 0459   ALBUMIN 2.1* 01/03/2015 0515   AST 20 01/02/2015 0459   ALT 12* 01/02/2015 0459   ALKPHOS 69 01/02/2015 0459   BILITOT 0.5 01/02/2015 0459   GFRNONAA >60 01/04/2015 0322   GFRAA >60 01/04/2015 0322   COAGSNo results found for: INR, PROTIME Lipid Panel    Component Value Date/Time   CHOL 143 12/30/2014 0453   TRIG 81 01/02/2015 0459   HDL 23* 12/30/2014 0453   CHOLHDL 6.2 12/30/2014 0453   VLDL 23 12/30/2014 0453   LDLCALC 97 12/30/2014 0453   HgbA1C  Lab Results  Component  Value Date   HGBA1C 5.8* 12/30/2014    SIGNIFICANT DIAGNOSTIC STUDIES  Ct Head Wo Contrast 12/29/2014 No evidence for complication related endovascular treatment, such as subarachnoid or parenchymal hemorrhage. No evidence for cortical ischemia within the LEFT MCA or LEFT ACA territory. Acute RIGHT MCA and LEFT PCA infarcts appear unchanged as previously outlined on earlier perfusion scan.   Ct Angio Head & Neck W/cm &/or Wo/cm 12/29/2014 1. Acute appearing occlusion of the left ICA from the cervical portion to the ophthalmic segment. 2. Right M1 -M2 branch occlusion of the inferior division with completed infarct. 3. High-grade stenosis of the P2 and P3 segments with completed medial occipital lobe infarct. 4. Diffuse moderate right ICA stenosis with appearance of dissection, favored chronic given low-density mural appearance. 5. High-grade stenosis of the bilateral V1 segments. 6. Extensive intracranial atherosclerosis with multi focal moderate stenosis.   Ct Cerebral Perfusion W/cm 12/29/2014 1. Completed infarcts involving the inferior division right MCA territory and in the left PCA territory at the occipital lobe. Associated cytotoxic edema is newly seen since yesterday's CT. 2. Extensive left cerebral ischemia/penumbra in the setting of left ICA occlusion.   Dg Chest Port 1 View 12/30/2014 Improved aeration of the left lung. Persistent dense consolidation or atelectasis in the left lower lobe and likely small left pleural effusion.  12/29/2014 1. New significant opacity in the left lung consisting of left pleural effusion and atelectasis or infiltrate. 2. Postoperative changes in the left lung. 3. Placement of endotracheal tube and nasogastric tube.  12/31/2014 IMPRESSION: NG tube with tip in proximal stomach just below the GE junction. Endotracheal tube with tip 7.8 cm above the carina. No pneumothorax. Small left pleural effusion with left basilar atelectasis or infiltrate.  R &  L common carotidarteriogram LT ICA and LT MCA occlusion with recannulization TICI 3. Status post endovascular complete revascularization of occluded left internal carotid artery and partially left middle cerebral artery using one pass with the 4 x 40 mm Solitaire FR stent retrieval device, and 3 mg of superselective intracranial intra-arterial Integrelin as described above with restoration of a TICI 3 flow.  2D echo - The patient was in atrial fibrillation. Moderate LV hypertrophy. EF 30% with wall motion abnormalities as noted above. Aortic sclerosis without significant stenosis. Mildly dilated RV withmildly decreased systolic function. Moderate biatrialenlargement.  MRI and MRA brain 12/30/2014 1. Oral enteric tubing is looped in the pharynx. Recommend repositioning. 2. Moderate to large right MCA infarct, sparing the anterior division. Associated occlusion of the right MCA M2 posterior division. 3. Moderate to large left PCA infarct. Associated occlusion of the left PCA P3 superior division. 4. Improved patency of the left ICA siphon  since the CTA yesterday. Moderate to severe bilateral ICA irregularity and stenosis. 5. Trace petechial hemorrhage in the left PCA territory. No significant intracranial mass effect at this time and no malignant hemorrhagic transformation.      HISTORY OF PRESENT ILLNESS Joseph Reed is a 69 y.o. male with multiple medical problems and a recent stroke 3 weeks ago. Patient was on anticoagulation and was admitted to [facility] with a GI bleed. Patient had to have all anticoagulation and antiplatelet therapy discontinued. On the day prior to admission to [facility] he (12/28/2014) was noted to have some right sided weakness (last known well 12/28/2014 at 22:00). The morning of transfer to [facility] (12/29/2014) he was found flaccid on the right and unable to communicate. He was transferred to [facility] for possible interventional procedure. tPA was not considered as he was  outside time window, with recent cerebral infarct and acute GI bleed.     HOSPITAL COURSE Mr. Joseph Reed is a 69 y.o. male with history of MI s/p stenting in 06/2014, HTN, smoker, lung Cancer, recent stroke found to have afib on Xarelto and plavix but pt stopped Xarelto two weeks ago, recent LGIB admitted to [facility] for blood transfusion, transferred to [facility] from [place] for aphasia and right LE weakness. not a tPA candidate d/t delay in arrival, recente cerebral infarct and acute GIB. He was taken to IR where he received complete revascularization of LT ICA and LT MCA occlusion with 1 pass of the Solitaire and 50m IA integrelin with restoration of TICI 3 flow.  Stroke: R MCA, L PCA infarcts on CTP s/p L ICA TICI3 revascularization. Stroke etiology likely due to afib not on anticoagulation due to GIB. However, pt does have significant large vessel athero, which could be etiology for stroke too.   Resultant lethargic state, right sided hemiplegia, global aphasia, dysphagia  MRI confirmed left PCA and right MCA infarcts  MRA left ICA remains open but has bilateral ICA atherosclerosis, right M2 and left P2 occlusion  CTA head and neck and CT Perfusion - left ICA occlusion, right MCA infarct and left PCA infarct but left MCA and ACA are open  Angiogram - left ICA occlusion with TICI3 recannulization using Solitaire and 371mIA integrelin  2D Echo EF 30%, no embolus seen   LDL 97  HgbA1c 5.8  clopidogrel 75 mg daily and Xarelto (rivaroxaban) daily prior to admission for atrial fibrillation, initially treated with ASA 32559mn the hospital  Therapy recommendations: SNF  The patient's prognosis is poor for meaningful recovery. Dr. SetLeonie Mand Dr ByrLamonte Sakairee with not intubating and DNR. Dr. SetLeonie Mant with the family and discussed goals of care and palliative care. Wife agreed to DO NOT RESUSCITATE and comfort care. She [facility].  [facility] agitation and secretion for terminal diagnosis of lung cancer and CVA. Prognosis 6 months or less.  History of stroke  06/2014 - after cardiac cath, MRI showed two punctate infarct - brainstem and right parietal WM  12/07/14 - small right ACA and punctate right peri-ventricle  Intracranial atherosclerosis  Left ICA occlusion s/p recannulization  Right ICA diffuse stenosis  Right M2 occlusion  Left P2 occlusion  Bilateral VA stenosis  Diffuse intracranial athero with multi focal moderate stenosis  Atrial Fibrillation  Home anticoagulation: xarelto and plavix  Pt stopped Xarelto by himself prior to GIB  No AC in hospital due  to large stroke and recent GIB requiring blood transfusion.  Lung cancer  Squamous cell lung cancer (stage 3) s/p LLL lobectomy 09/2013 - pt was supposed to have post op chemo / XRT but he apparently declined.  With adjacent lymph node involvement  Left lung atelectasis  On initial CXR Treated with chest PT, suctioning, albuterol and mucomyst  Repeat CXR improved aeration   Lower GIB  Hgb 5.8 at [place], S/p 4u PRBCs at [facility]  Suspected variceal bleeding  Taking motrin bid for arthritis pain prior to admission to [facility] 9.7-> last 8.3  Respiratory Failure  Intubated for neuro intervention  CXR with near complete opacity with Mucous plug  Extubated 10/30  CAD with MI s/p stent  On plavix prior to [place]  Had MI in 06/2014 s/p PCI and stent  Tobacco abuse  Current smoker  Hypertension  Hyperlipidemia  Home meds: unclear  LDL 97, goal < 70  Put on lipitor 80  Other Stroke Risk Factors  Advanced age  hx ETOH abuse  Hx CHF  Other Active Problems  Hx COPD, emphysema  Troponin elevation, felt to be demand ischemia due to stroke  Hypocalcemia  Febrile, TMax 101.3  Severe malnutrition  related to chronic illness   DISCHARGE EXAM Blood pressure 179/123, pulse 169, temperature 98.9 F (37.2 C), temperature source Axillary, resp. rate 20, height _0  (1.854 m), weight 58.741 kg (129 lb 8 oz), SpO2 98 %. General - Well nourished, well developed, Elderly male  HEENT- NCAT, pupils reactive, sclera clear, intubated Cardiovascular - irregularly irregular heart rate and rhythm Pulmonary: CTA GI- ND, normal bowel sounds GU- deferred Extrem- No C/C/E  NEURO EXAM MENTAL STATUS: , eyes open without stimulation, not following commands . Mute aphasic.  CRANIAL NERVES: PERRL; blinks to visual threat  Right gaze preference but will cross midline with OCRs Absent corneal on the right; sluggish on the left Positive gag  MOTOR/SENSORY EXAM: Spontaneously moving right UE and LE, and moves against gravity; with noxious stimuli trace withdraw LUE and 2/5 LLE.   GAIT: Deferred   Discharge Diet   Diet NPO time specified liquids  DISCHARGE PLAN  Disposition:  [place] for comfort care .  Follow-up MD with [facility]  45 minutes were spent preparing discharge.  [place] for Pager information 01/06/2015 1:28 PM   I have personally examined this patient, reviewed notes, independently viewed imaging studies, participated in medical decision making and plan of care. I have made any additions or clarifications directly to the above note. Agree with note above.    Antony Contras, MD Medical Director [facility] Pager: [phone] 01/06/2015 4:28 PM

## 2015-01-06 NOTE — Clinical Documentation Improvement (Signed)
Critical Care  Can the diagnosis of Malnutrition be further specified?   Document Severity - Severe(third degree), Moderate (second degree), Mild (first degree)  Other condition  Unable to clinically determine  Document any associated diagnoses/conditions  Supporting Information:  Initial Nutrition Assessment by Asencion Islam, RD at 12/31/2014 11:49 AM  DOCUMENTATION CODES:  Severe malnutrition in context of chronic illness  INTERVENTION:  If pt remains intubated recommend:  Initiate Vital AF 1.2 @ 15 ml/hr via OG tube and increase by 10 ml every 8 hours to goal rate of 75 ml/hr.  Tube feeding regimen provides 2160 kcal, 135 grams of protein, and 1504 ml of H2O.  Recommend:  monitor magnesium, potassium, and phosphorus daily for at least 3 days, MD to replete as needed, as pt is at risk for refeeding syndrome given severe malnutrition.  NUTRITION DIAGNOSIS:  Malnutrition related to chronic illness as evidenced by severe depletion of body fat, severe depletion of muscle mass, energy intake < or equal to 75% for > or equal to 1 month, 13 percent weight loss in last 6 months  Please update your documentation within the medical record to reflect your response to this query. Thank you  Please exercise your independent, professional judgment when responding. A specific answer is not anticipated or expected.  Thank You, Alessandra Grout, RN, BSN, CCDS,Clinical Documentation Specialist:  [phone]  [phone]=[facility] [place]- Health Information Management

## 2015-01-06 NOTE — Progress Notes (Signed)
Patient has a noted b/p at this time of 179/123 and heart rate of 169 irregular. Dr. Yevonne Aline paged to make aware for any intervention. Patient remains unresponsive, except opens eyes randomly. [facility] [employer]

## 2015-01-06 NOTE — Progress Notes (Signed)
STROKE TEAM PROGRESS NOTE  HISTORY Joseph Reed is a 69 y.o. male with multiple medical problems and a recent stroke 3 weeks ago. Patient was on anticoagulation and was admitted to [facility] with a GI bleed. Patient had to have all anticoagulation and antiplatelet therapy discontinued. On the day prior to admission he was noted to have some right sided weakness. The morning of admission he was found flaccid on the right and unable to communicate. Patient transferred here for possible interventional procedure.   Date last known well: Date: 12/28/2014 Time last known well: Time: 22:00 tPA Given: No: Outside time window, recent infarct, GI bleed   SUBJECTIVE (INTERVAL HISTORY) No events overnight.     Patient  is now slightly more arousable but remains mute and aphasic with dense left hemiparesis I spoke to wife yesterday  at the bedside who agreed to DO NOT RESUSCITATE and comfort care and would prefer patient being transferred to hospice in [facility] where they live OBJECTIVE Temp:  [98.6 F (37 C)-99.1 F (37.3 C)] 98.9 F (37.2 C) (11/03 0534) Pulse Rate:  [144-169] 169 (11/03 0534) Cardiac Rhythm:  [-]  Resp:  [20-26] 20 (11/03 0534) BP: (154-179)/(91-123) 179/123 mmHg (11/03 0534) SpO2:  [97 %-99 %] 98 % (11/03 0534) Weight:  [129 lb 8 oz (58.741 kg)] 129 lb 8 oz (58.741 kg) (11/03 0534)  CBC:   Recent Labs Lab 01/02/15 0459 01/03/15 0515  WBC 9.4 8.6  NEUTROABS 6.5 6.2  HGB 8.3* 8.3*  HCT 27.5* 26.6*  MCV 91.4 90.2  PLT 288 465    Basic Metabolic Panel:   Recent Labs Lab 01/02/15 0459 01/03/15 0515 01/04/15 0322  NA 133* 139 138  K 3.6 3.6 3.7  CL 106 110 108  CO2 21* 21* 23  GLUCOSE 132* 107* 105*  BUN '13 15 16  '$ CREATININE 0.87 0.90 0.94  CALCIUM 7.9* 8.2* 8.2*  MG 2.0 1.9  --   PHOS 2.6 2.4*  --     Lipid Panel:     Component Value Date/Time   CHOL 143 12/30/2014 0453   TRIG 81 01/02/2015 0459   HDL 23* 12/30/2014 0453   CHOLHDL 6.2  12/30/2014 0453   VLDL 23 12/30/2014 0453   LDLCALC 97 12/30/2014 0453   HgbA1c:  Lab Results  Component Value Date   HGBA1C 5.8* 12/30/2014   Urine Drug Screen: No results found for: LABOPIA, COCAINSCRNUR, LABBENZ, AMPHETMU, THCU, LABBARB    IMAGING I have personally reviewed the radiological images below and agree with the radiology interpretations.  Ct Head Wo Contrast 12/29/2014   No evidence for complication related endovascular treatment, such as subarachnoid or parenchymal hemorrhage. No evidence for cortical ischemia within the LEFT MCA or LEFT ACA territory. Acute RIGHT MCA and LEFT PCA infarcts appear unchanged as previously outlined on earlier perfusion scan.   Ct Angio Head & Neck W/cm &/or Wo/cm 12/29/2014   1. Acute appearing occlusion of the left ICA from the cervical portion to the ophthalmic segment. 2. Right M1 -M2 branch occlusion of the inferior division with completed infarct. 3. High-grade stenosis of the P2 and P3 segments with completed medial occipital lobe infarct. 4. Diffuse moderate right ICA stenosis with appearance of dissection, favored chronic given low-density mural appearance. 5. High-grade stenosis of the bilateral V1 segments. 6. Extensive intracranial atherosclerosis with multi focal moderate stenosis.   Ct Cerebral Perfusion W/cm 12/29/2014  1. Completed infarcts involving the inferior division right MCA territory and in the left PCA territory  at the occipital lobe. Associated cytotoxic edema is newly seen since yesterday's CT. 2. Extensive left cerebral ischemia/penumbra in the setting of left ICA occlusion.   Dg Chest Port 1 View 12/30/2014  Improved aeration of the left lung. Persistent dense consolidation or atelectasis in the left lower lobe and likely small left pleural effusion.  12/29/2014   1. New significant opacity in the left lung consisting of left pleural effusion and atelectasis or infiltrate. 2. Postoperative changes in the left lung. 3.  Placement of endotracheal tube and nasogastric tube.  12/31/2014  IMPRESSION: NG tube with tip in proximal stomach just below the GE junction. Endotracheal tube with tip 7.8 cm above the carina. No pneumothorax. Small left pleural effusion with left basilar atelectasis or infiltrate.  R & L common carotidarteriogram LT ICA and LT MCA occlusion with recannulization TICI 3  2D echo - - The patient was in atrial fibrillation. Moderate LV hypertrophy. EF 30% with wall motion abnormalities as noted above. Aortic sclerosis without significant stenosis. Mildly dilated RV with mildly decreased systolic function. Moderate biatrial enlargement.  MRI and MRA brain 12/30/2014  IMPRESSION: 1. Oral enteric tubing is looped in the pharynx. Recommend repositioning. 2. Moderate to large right MCA infarct, sparing the anterior division. Associated occlusion of the right MCA M2 posterior division. 3. Moderate to large left PCA infarct. Associated occlusion of the left PCA P3 superior division. 4. Improved patency of the left ICA siphon since the CTA yesterday. Moderate to severe bilateral ICA irregularity and stenosis. 5. Trace petechial hemorrhage in the left PCA territory. No significant intracranial mass effect at this time and no malignant hemorrhagic transformation.     PHYSICAL EXAM  Temp:  [98.6 F (37 C)-99.1 F (37.3 C)] 98.9 F (37.2 C) (11/03 0534) Pulse Rate:  [144-169] 169 (11/03 0534) Resp:  [20-26] 20 (11/03 0534) BP: (154-179)/(91-123) 179/123 mmHg (11/03 0534) SpO2:  [97 %-99 %] 98 % (11/03 0534) Weight:  [129 lb 8 oz (58.741 kg)] 129 lb 8 oz (58.741 kg) (11/03 0534)  General - Well nourished, well developed,  Elderly male  HEENT-  NCAT, pupils reactive, sclera clear, intubated Cardiovascular - irregularly irregular heart rate and rhythm Pulmonary:  CTA GI- ND, normal bowel sounds GU- deferred Extrem- No C/C/E  NEURO EXAM MENTAL STATUS:  , eyes open without stimulation,  not following commands  . Mute aphasic.  CRANIAL NERVES: PERRL; blinks to visual threat  Right gaze preference but will cross midline with OCRs Absent corneal on the right; sluggish on the left Positive gag  MOTOR/SENSORY EXAM: Spontaneously moving right UE and LE, and moves against gravity; with noxious stimuli trace withdraw LUE and 2/5 LLE.   GAIT:   Deferred   ASSESSMENT/PLAN Mr. Vadhir Mcnay is a 69 y.o. male with history of MI s/p stenting in 06/2014, HTN, smoker, lung  Cancer, recent stroke found to have afib on Xarelto and plavix but pt stopped Xarelto two weeks ago, recent LGIB admitted to [facility] for blood transfusion, transferred to [facility] from [place] for aphasia and right LE weakness.  S/p complete revascularization of LT ICA occlusion.    Stroke:  R MCA, L PCA infarcts, showing on CTP. S/P L ICA TICI3 revascularization. Stroke etiology likely due to afib not on anticoagulation due to GIB. However, pt does have significant large vessel athero, which could be etiology for stroke too. However, his initial right side weakness with aphasia did not fit to the stroke identified by CT, needs MRI for further evaluation.  Resultant  Intubated with left sided weakness  MRI confirmed left PCA and right MCA infarcts  MRA left ICA remains open but b/l ICA athero, right M2 and left P2 occlusion  CTA head and neck and CTP - left ICA occlusion, right MCA infarct and left PCA infarct but left MCA and ACA are open  Angio - left ICA occlusion with TICI3 recannulization  2D Echo EF 30%   LDL 97  HgbA1c 5.8  lovenox for VTE prophylaxis Diet NPO time specified  clopidogrel 75 mg daily and Xarelto (rivaroxaban) daily prior to admission for atrial fibrillation, now on ASA '325mg'$ .  Ongoing aggressive stroke risk factor management  Therapy recommendations:  pending   Disposition:  pending   History of stroke  06/2014 - after cardiac cath, MRI showed two punctate infarct  - brainstem and right parietal WM  12/07/14 - small right ACA and punctate right peri-ventricle   Intracranial atherosclerosis  Left ICA occlusion s/p recannulization  Right ICA diffuse stenosis  Right M2 occlusion  Left P2 occlusion  Bilateral VA stenosis  Diffuse intracranial athero with multi focal moderate stenosis  Atrial Fibrillation  Home anticoagulation:  xarelto and plavix  Pt stopped Xarelto by himself prior to GIB  Hold off AC due to large stroke and recent GIB requiring blood transfusion.   HR 80's - 100's now  Monitoring HR, may consider Cardizem if RVR  Lung cancer  Squamous cell lung cancer s/p LLL lobectomy 09/2013 - pt was supposed to have post op chemo / XRT but he apparently declined.  With adjacent lymph node involvement  Staging not done yet  Left lung atelectasis  Overnight CXR comfirmed  On chest PT, suctioning, albuterol and mucomyst  Repeat CXR improved aeration   CCM on board  LGIB  Hgb 5.8 at [place], S/p 4u PRBCs at [facility]  Suspected variceal bleeding  Taking motrin bid for arthritis pain prior to admission to [facility] 9.7-> 8.4  currently on ASA for stroke   Hb and GIB Q12   Respiratory Failure  Intubated for neuro intervention  CXR with near complete opacity with Mucous plug  Can not extubate today  CAD with MI s/p stent  On plavix prior to [place]  Had MI in 06/2014 s/p PCI and stent  Tobacco abuse  Current smoker  Hypertension  Stable on the low side Permissive hypertension (OK if < 220/120) but gradually normalize in 5-7 days  Hyperlipidemia  Home meds:  unclear  LDL 97, goal < 70  Put on lipitor 80  continue statin at discharge  Other Stroke Risk Factors  Advanced age  hx ETOH abuse  Hx CHF  Other Active Problems  Hx COPD, emphysema  Troponin elevation, felt to be demand ischemia due to stroke  Hypocalcemia  Febrile, TMax 101.3  The patient's prognosis remains poor  and I agree with  Palliative care and DNR.  Transfer to paliiative care  Nursing home in [facility] when bed avaialble. full comfort care.    SIGNED BY:   Antony Contras, MD  To contact Stroke Continuity provider, please refer to [email or web address] After hours, contact General Neurology

## 2015-02-03 DEATH — deceased

## 2015-04-12 NOTE — Patient Outreach (Signed)
[facility]) Care Management  04/12/2015  Murel Shenberger 26-Sep-1945 [id number]   Patient deceased. mRS = 6.

## 2016-08-15 IMAGING — CR DG CHEST 1V PORT
1 series · 1 of 1 positions shown · non-contrast
Comparison: Earlier today

CLINICAL DATA: Endotracheal tube advancement

EXAM:
PORTABLE CHEST 1 VIEW

[AP]
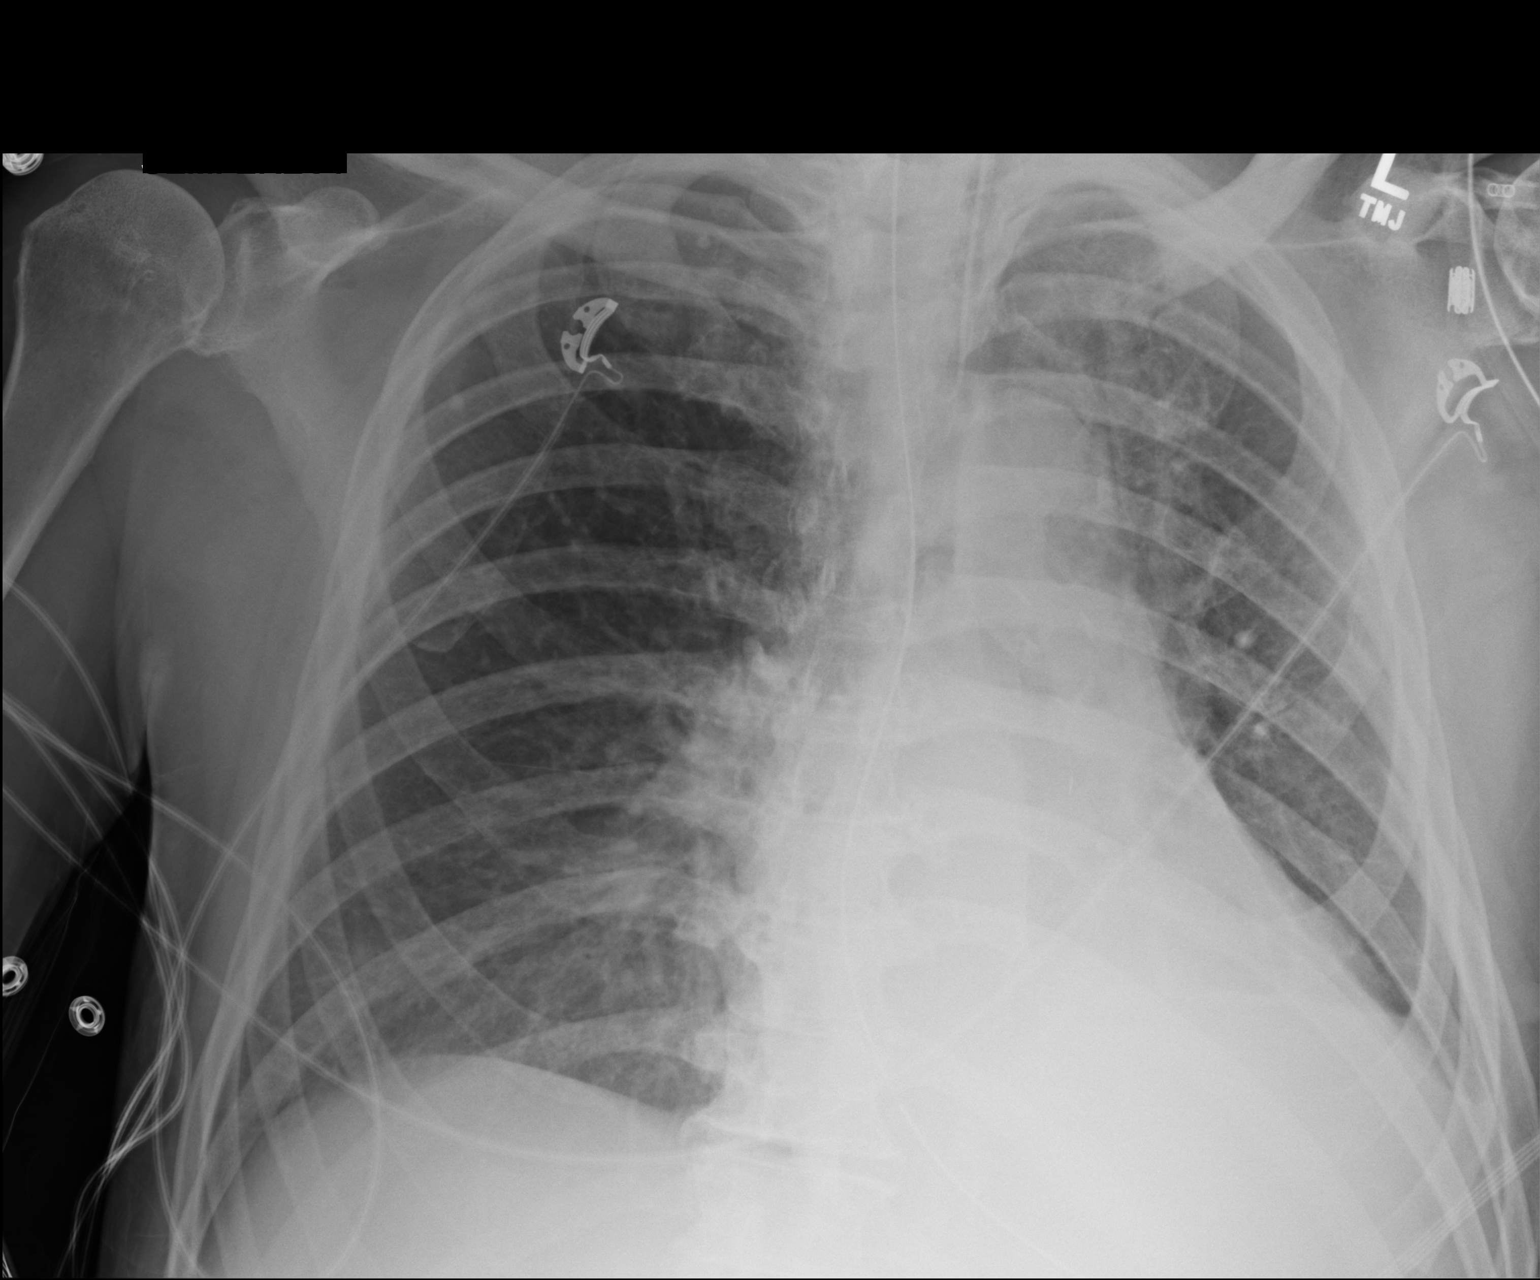

[1 of 1 positions shown; findings below may reference images not displayed]

FINDINGS: Endotracheal tube in good position, tip just below the clavicular
heads. This is mildly advanced from earlier. Orogastric tube with
side port at the lower esophagus, tip not visualized on this study.

Unchanged cardiomegaly. Left pleural effusion with atelectasis, with
improved aeration at the left base from earlier. Hazy appearance of
the right base, likely en face atelectasis. No pneumothorax.
IMPRESSION: 1. Endotracheal tube tip in good position.
2. Unchanged orogastric tube with side port at the lower esophagus
and tip at the proximal stomach.
3. Improved left basilar aeration since earlier today.

## 2016-08-18 IMAGING — CR DG CHEST 1V PORT
1 series · 1 of 1 positions shown · non-contrast
Comparison: Portable chest x-ray January 02, 2015

CLINICAL DATA: Acute respiratory failure, ventilated patient,
anoxic brain injury, history of COPD and CHF

EXAM:
PORTABLE CHEST 1 VIEW

[AP]
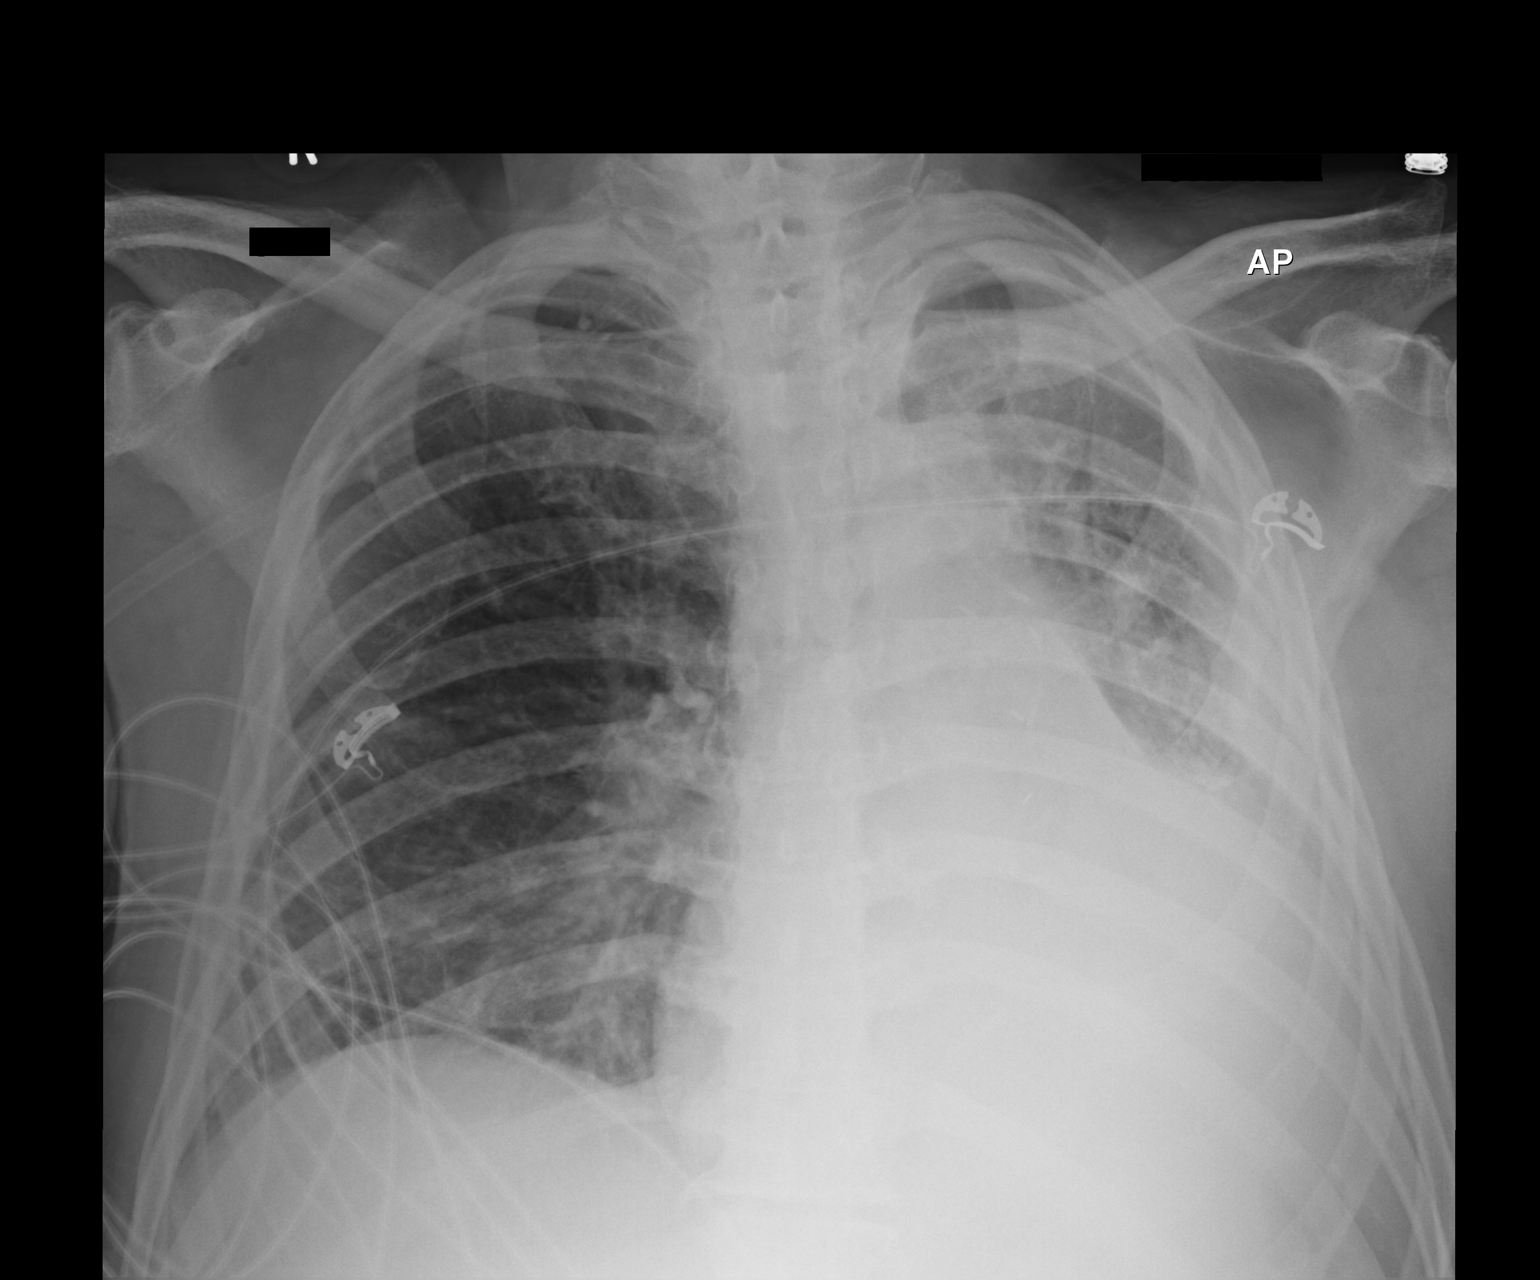

[1 of 1 positions shown; findings below may reference images not displayed]

FINDINGS: The lungs are well-expanded. Density in the left hemithorax has
increased. The right lung is grossly clear. The cardiac silhouette
is top-normal in size. The pulmonary vascularity is slightly more
conspicuous today. There is no pneumothorax. The bony thorax
exhibits no acute abnormality. There is are 2 stable 3-5 mm
calcification in the right upper lobe.
IMPRESSION: Worsening of atelectasis or pneumonia in the left lower lobe. This
may reflect aspiration. A small left pleural effusion may be
present. Persistent right infrahilar subsegmental atelectasis.
Borderline cardiomegaly without significant pulmonary edema.
